# Patient Record
Sex: Female | Born: 1990 | Race: Black or African American | Hispanic: No | Marital: Single | Smoking: Current every day smoker
Health system: Southern US, Community
[De-identification: ages and names within clinical notes are randomized; demographics above are authoritative.]

## PROBLEM LIST (undated history)

## (undated) DIAGNOSIS — J45909 Unspecified asthma, uncomplicated: Secondary | ICD-10-CM

## (undated) DIAGNOSIS — G43909 Migraine, unspecified, not intractable, without status migrainosus: Secondary | ICD-10-CM

## (undated) DIAGNOSIS — F329 Major depressive disorder, single episode, unspecified: Secondary | ICD-10-CM

## (undated) DIAGNOSIS — I10 Essential (primary) hypertension: Secondary | ICD-10-CM

## (undated) DIAGNOSIS — J302 Other seasonal allergic rhinitis: Secondary | ICD-10-CM

## (undated) DIAGNOSIS — F32A Depression, unspecified: Secondary | ICD-10-CM

## (undated) HISTORY — PX: TONSILLECTOMY: SUR1361

---
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
## 2013-04-04 ENCOUNTER — Emergency Department (HOSPITAL_BASED_OUTPATIENT_CLINIC_OR_DEPARTMENT_OTHER)
Admission: EM | Admit: 2013-04-04 | Discharge: 2013-04-04 | Disposition: A | Discharge status: Home / Self Care | Payer: Medicare Other | Attending: Emergency Medicine | Admitting: Emergency Medicine

## 2013-04-04 ENCOUNTER — Encounter (HOSPITAL_BASED_OUTPATIENT_CLINIC_OR_DEPARTMENT_OTHER): Payer: Self-pay | Admitting: *Deleted

## 2013-04-04 ENCOUNTER — Emergency Department (HOSPITAL_BASED_OUTPATIENT_CLINIC_OR_DEPARTMENT_OTHER): Payer: Medicare Other

## 2013-04-04 DIAGNOSIS — O9989 Other specified diseases and conditions complicating pregnancy, childbirth and the puerperium: Secondary | ICD-10-CM | POA: Insufficient documentation

## 2013-04-04 DIAGNOSIS — F329 Major depressive disorder, single episode, unspecified: Secondary | ICD-10-CM | POA: Insufficient documentation

## 2013-04-04 DIAGNOSIS — O2390 Unspecified genitourinary tract infection in pregnancy, unspecified trimester: Secondary | ICD-10-CM | POA: Insufficient documentation

## 2013-04-04 DIAGNOSIS — R109 Unspecified abdominal pain: Secondary | ICD-10-CM | POA: Insufficient documentation

## 2013-04-04 DIAGNOSIS — N760 Acute vaginitis: Secondary | ICD-10-CM

## 2013-04-04 DIAGNOSIS — J45909 Unspecified asthma, uncomplicated: Secondary | ICD-10-CM | POA: Insufficient documentation

## 2013-04-04 DIAGNOSIS — O99330 Smoking (tobacco) complicating pregnancy, unspecified trimester: Secondary | ICD-10-CM | POA: Insufficient documentation

## 2013-04-04 DIAGNOSIS — Z79899 Other long term (current) drug therapy: Secondary | ICD-10-CM | POA: Insufficient documentation

## 2013-04-04 DIAGNOSIS — O99340 Other mental disorders complicating pregnancy, unspecified trimester: Secondary | ICD-10-CM | POA: Insufficient documentation

## 2013-04-04 DIAGNOSIS — O169 Unspecified maternal hypertension, unspecified trimester: Secondary | ICD-10-CM | POA: Insufficient documentation

## 2013-04-04 DIAGNOSIS — O26899 Other specified pregnancy related conditions, unspecified trimester: Secondary | ICD-10-CM

## 2013-04-04 DIAGNOSIS — F3289 Other specified depressive episodes: Secondary | ICD-10-CM | POA: Insufficient documentation

## 2013-04-04 HISTORY — DX: Major depressive disorder, single episode, unspecified: F32.9

## 2013-04-04 HISTORY — DX: Other seasonal allergic rhinitis: J30.2

## 2013-04-04 HISTORY — DX: Unspecified asthma, uncomplicated: J45.909

## 2013-04-04 HISTORY — DX: Essential (primary) hypertension: I10

## 2013-04-04 HISTORY — DX: Depression, unspecified: F32.A

## 2013-04-04 LAB — CBC WITH DIFFERENTIAL/PLATELET
HCT: 33 % — ABNORMAL LOW (ref 36.0–46.0)
Hemoglobin: 12 g/dL (ref 12.0–15.0)
Lymphocytes Relative: 37 % (ref 12–46)
Lymphs Abs: 3.3 10*3/uL (ref 0.7–4.0)
MCV: 74.2 fL — ABNORMAL LOW (ref 78.0–100.0)
Monocytes Absolute: 1 10*3/uL (ref 0.1–1.0)
Monocytes Relative: 11 % (ref 3–12)
Neutro Abs: 4.4 10*3/uL (ref 1.7–7.7)
WBC: 8.8 10*3/uL (ref 4.0–10.5)

## 2013-04-04 LAB — URINALYSIS, ROUTINE W REFLEX MICROSCOPIC
Bilirubin Urine: NEGATIVE
Glucose, UA: NEGATIVE mg/dL
Hgb urine dipstick: NEGATIVE
Specific Gravity, Urine: 1.012 (ref 1.005–1.030)

## 2013-04-04 LAB — WET PREP, GENITAL
Trich, Wet Prep: NONE SEEN
Yeast Wet Prep HPF POC: NONE SEEN

## 2013-04-04 LAB — PREGNANCY, URINE: Preg Test, Ur: POSITIVE — AB

## 2013-04-04 LAB — BASIC METABOLIC PANEL
BUN: 6 mg/dL (ref 6–23)
CO2: 26 mEq/L (ref 19–32)
Calcium: 9.2 mg/dL (ref 8.4–10.5)
Creatinine, Ser: 0.7 mg/dL (ref 0.50–1.10)
Glucose, Bld: 89 mg/dL (ref 70–99)
Sodium: 139 mEq/L (ref 135–145)

## 2013-04-04 MED ORDER — METRONIDAZOLE 500 MG PO TABS
2000.0000 mg | ORAL_TABLET | Freq: Once | ORAL | Status: AC
  Administered 2013-04-04: 2000 mg via ORAL
  Filled 2013-04-04: qty 4

## 2013-04-04 NOTE — ED Notes (Signed)
[redacted] weeks pregnant. C.o lower mid abdominal pain that she describes at "just hurts". Not able to eat. Denies nausea. Ambulatory to the bathroom without difficulty.

## 2013-04-04 NOTE — ED Notes (Signed)
Diane from [facility] blood bank called to give blood type and rh, O positive.  Results given to Dr. Bernette Mayers.

## 2013-04-04 NOTE — ED Provider Notes (Signed)
History     CSN: [id number]  Arrival date & time 04/04/13  1155   First MD Initiated Contact with Patient 04/04/13 1210      Chief Complaint  Patient presents with  . Abdominal Pain    (Consider location/radiation/quality/duration/timing/severity/associated sxs/prior treatment) HPI Comments: Pt states that she started with generalized cramping this morning:pt states that she had a positive pregnancy test at pcp, but has not seen ob and has not had ultrasound:this is first pregancy  Patient is a 22 y.o. female presenting with abdominal pain. The history is provided by the patient. No language interpreter was used.  Abdominal Pain Pain location: lower abdomen. Pain quality: aching   Pain radiates to:  Does not radiate Pain severity:  Moderate Onset quality:  Gradual Timing:  Constant Chronicity:  New Context: not alcohol use   Relieved by:  Nothing Worsened by:  Nothing tried Ineffective treatments:  None tried Associated symptoms: no dysuria, no fever, no nausea, no vaginal bleeding and no vaginal discharge     Past Medical History  Diagnosis Date  . Hypertension   . Asthma   . Seasonal allergies   . Depression     Past Surgical History  Procedure Laterality Date  . Tonsillectomy      No family history on file.  History  Substance Use Topics  . Smoking status: Current Every Day Smoker -- 0.50 packs/day    Types: Cigarettes  . Smokeless tobacco: Not on file  . Alcohol Use: No    OB History   Grav Para Term Preterm Abortions TAB SAB Ect Mult Living   1               Review of Systems  Constitutional: Negative for fever.  Respiratory: Negative.   Cardiovascular: Negative.   Gastrointestinal: Positive for abdominal pain. Negative for nausea.  Genitourinary: Negative for dysuria, vaginal bleeding and vaginal discharge.    Allergies  Review of patient's allergies indicates no known allergies.  Home Medications   Current Outpatient Rx  Name  Route   Sig  Dispense  Refill  . amLODipine (NORVASC) 10 MG tablet   Oral   Take 10 mg by mouth daily.         . DULoxetine (CYMBALTA) 60 MG capsule   Oral   Take 60 mg by mouth daily.         . hydrochlorothiazide (HYDRODIURIL) 25 MG tablet   Oral   Take 25 mg by mouth daily.         Marland Kitchen PRENATAL VITAMINS PO   Oral   Take by mouth.           BP 138/84  Pulse 75  Temp(Src) 97.5 F (36.4 C) (Oral)  Resp 18  Ht 5' 9.5" (1.765 m)  Wt 285 lb (129.275 kg)  BMI 41.5 kg/m2  SpO2 100%  LMP 02/24/2013  Physical Exam  Nursing note and vitals reviewed. Constitutional: She is oriented to person, place, and time. She appears well-developed and well-nourished.  HENT:  Head: Normocephalic and atraumatic.  Eyes: Conjunctivae and EOM are normal.  Neck: Normal range of motion. Neck supple.  Cardiovascular: Normal rate and regular rhythm.   Pulmonary/Chest: Effort normal and breath sounds normal.  Abdominal: Soft. Bowel sounds are normal. There is tenderness in the right lower quadrant and left lower quadrant.  Genitourinary:  White discharge:-cmt  Musculoskeletal: Normal range of motion.  Neurological: She is alert and oriented to person, place, and time.  Skin: Skin is  warm and dry.    ED Course  Procedures (including critical care time)  Labs Reviewed  WET PREP, GENITAL - Abnormal; Notable for the following:    Clue Cells Wet Prep HPF POC FEW (*)    WBC, Wet Prep HPF POC FEW (*)    All other components within normal limits  PREGNANCY, URINE - Abnormal; Notable for the following:    Preg Test, Ur POSITIVE (*)    All other components within normal limits  CBC WITH DIFFERENTIAL - Abnormal; Notable for the following:    HCT 33.0 (*)    MCV 74.2 (*)    MCHC 36.4 (*)    Platelets 447 (*)    All other components within normal limits  HCG, QUANTITATIVE, PREGNANCY - Abnormal; Notable for the following:    hCG, Beta Chain, Quant, S 2278 (*)    All other components within normal  limits  GC/CHLAMYDIA PROBE AMP  URINALYSIS, ROUTINE W REFLEX MICROSCOPIC  BASIC METABOLIC PANEL  RH IG WORKUP (INCLUDES ABO/RH)   US Ob Comp Less 14 Wks  04/04/2013  *RADIOLOGY REPORT*  Clinical Data: Pelvic pain in early pregnancy.  Beta HCG 2278  OBSTETRIC <14 WK [place] AND TRANSVAGINAL OB US  Technique:  Both transabdominal and transvaginal ultrasound examinations were performed for complete evaluation of the gestation as well as the maternal uterus, adnexal regions, and pelvic cul-de-sac.  Transvaginal technique was performed to assess early pregnancy.  Comparison:  None.  Intrauterine gestational sac:  Visualized/normal in shape. Yolk sac: Seen Embryo: Not seen Cardiac Activity: Not applicable  MSD: 5.3 mm  5 w 2 d  Maternal uterus/adnexae: The left ovary has a normal appearance measuring 2.2 x 2.7 x 2.1 cm.  The right ovary has a normal appearance measuring 3.8 x 3.3 x 3.4 cm and containing a corpus luteum.  A small amount of simple free fluid is noted in the cul-de-sac and extending to the adnexa. No separate adnexal masses are seen.  IMPRESSION: Intrauterine gestational sac with yolk sac.  No evidence for a fetal pole is seen but would not necessarily be expected at today's mean sac diameter of 5.3 mm.  Follow-up evaluation can be performed sonographically in > 10 days to assess for appropriate progression of gestation as we would expect to see visualization of a fetal pole with confidence > 10 days following visualization of a gestational sac with yolk sac.  Normal ovaries with right corpus luteum.   Original Report Authenticated By: Rhodia Albright, M.D.    US Ob Transvaginal  04/04/2013  *RADIOLOGY REPORT*  Clinical Data: Pelvic pain in early pregnancy.  Beta HCG 2278  OBSTETRIC <14 WK [place] AND TRANSVAGINAL OB US  Technique:  Both transabdominal and transvaginal ultrasound examinations were performed for complete evaluation of the gestation as well as the maternal uterus, adnexal regions, and pelvic  cul-de-sac.  Transvaginal technique was performed to assess early pregnancy.  Comparison:  None.  Intrauterine gestational sac:  Visualized/normal in shape. Yolk sac: Seen Embryo: Not seen Cardiac Activity: Not applicable  MSD: 5.3 mm  5 w 2 d  Maternal uterus/adnexae: The left ovary has a normal appearance measuring 2.2 x 2.7 x 2.1 cm.  The right ovary has a normal appearance measuring 3.8 x 3.3 x 3.4 cm and containing a corpus luteum.  A small amount of simple free fluid is noted in the cul-de-sac and extending to the adnexa. No separate adnexal masses are seen.  IMPRESSION: Intrauterine gestational sac with yolk sac.  No evidence for a fetal pole is seen but would not necessarily be expected at today's mean sac diameter of 5.3 mm.  Follow-up evaluation can be performed sonographically in > 10 days to assess for appropriate progression of gestation as we would expect to see visualization of a fetal pole with confidence > 10 days following visualization of a gestational sac with yolk sac.  Normal ovaries with right corpus luteum.   Original Report Authenticated By: Rhodia Albright, M.D.      1. Abdominal pain in pregnancy   2. BV (bacterial vaginosis)       MDM  Pt is more comfortable at this time:intauterine sac and yolk sac noted:pt is to see [facility]:pt given flagyl for bv:pt is okay to follow up:pt instructed to take tylenol:abdomen is non acute       Teressa Lower, NP 04/04/13 1404

## 2013-04-04 NOTE — ED Provider Notes (Signed)
Medical screening examination/treatment/procedure(s) were performed by non-physician practitioner and as supervising physician I was immediately available for consultation/collaboration.   Charles B. Bernette Mayers, MD 04/04/13 1553

## 2013-04-05 LAB — GC/CHLAMYDIA PROBE AMP: GC Probe RNA: NEGATIVE

## 2013-05-13 ENCOUNTER — Emergency Department (HOSPITAL_BASED_OUTPATIENT_CLINIC_OR_DEPARTMENT_OTHER)
Admission: EM | Admit: 2013-05-13 | Discharge: 2013-05-14 | Disposition: A | Discharge status: Home / Self Care | Payer: Medicare Other | Attending: Emergency Medicine | Admitting: Emergency Medicine

## 2013-05-13 ENCOUNTER — Encounter (HOSPITAL_BASED_OUTPATIENT_CLINIC_OR_DEPARTMENT_OTHER): Payer: Self-pay | Admitting: Emergency Medicine

## 2013-05-13 DIAGNOSIS — O210 Mild hyperemesis gravidarum: Secondary | ICD-10-CM | POA: Insufficient documentation

## 2013-05-13 DIAGNOSIS — Z79899 Other long term (current) drug therapy: Secondary | ICD-10-CM | POA: Insufficient documentation

## 2013-05-13 DIAGNOSIS — J45909 Unspecified asthma, uncomplicated: Secondary | ICD-10-CM | POA: Insufficient documentation

## 2013-05-13 DIAGNOSIS — F329 Major depressive disorder, single episode, unspecified: Secondary | ICD-10-CM | POA: Insufficient documentation

## 2013-05-13 DIAGNOSIS — H538 Other visual disturbances: Secondary | ICD-10-CM | POA: Insufficient documentation

## 2013-05-13 DIAGNOSIS — O169 Unspecified maternal hypertension, unspecified trimester: Secondary | ICD-10-CM | POA: Insufficient documentation

## 2013-05-13 DIAGNOSIS — O99340 Other mental disorders complicating pregnancy, unspecified trimester: Secondary | ICD-10-CM | POA: Insufficient documentation

## 2013-05-13 DIAGNOSIS — F3289 Other specified depressive episodes: Secondary | ICD-10-CM | POA: Insufficient documentation

## 2013-05-13 DIAGNOSIS — O9989 Other specified diseases and conditions complicating pregnancy, childbirth and the puerperium: Secondary | ICD-10-CM | POA: Insufficient documentation

## 2013-05-13 DIAGNOSIS — H53149 Visual discomfort, unspecified: Secondary | ICD-10-CM | POA: Insufficient documentation

## 2013-05-13 DIAGNOSIS — O99330 Smoking (tobacco) complicating pregnancy, unspecified trimester: Secondary | ICD-10-CM | POA: Insufficient documentation

## 2013-05-13 DIAGNOSIS — R51 Headache: Secondary | ICD-10-CM | POA: Insufficient documentation
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
## 2013-05-13 NOTE — ED Notes (Signed)
Pt c/o headache x2 days

## 2013-05-14 LAB — URINALYSIS, ROUTINE W REFLEX MICROSCOPIC
Glucose, UA: NEGATIVE mg/dL
Hgb urine dipstick: NEGATIVE
Protein, ur: NEGATIVE mg/dL

## 2013-05-14 MED ORDER — PROMETHAZINE HCL 25 MG/ML IJ SOLN
25.0000 mg | Freq: Once | INTRAMUSCULAR | Status: AC
  Administered 2013-05-14: 25 mg via INTRAMUSCULAR
  Filled 2013-05-14: qty 1

## 2013-05-14 MED ORDER — ACETAMINOPHEN 325 MG PO TABS
650.0000 mg | ORAL_TABLET | Freq: Once | ORAL | Status: AC
  Administered 2013-05-14: 650 mg via ORAL
  Filled 2013-05-14: qty 2

## 2013-05-14 MED ORDER — ONDANSETRON 8 MG PO TBDP
8.0000 mg | ORAL_TABLET | Freq: Once | ORAL | Status: AC
  Administered 2013-05-14: 8 mg via ORAL
  Filled 2013-05-14: qty 1

## 2013-05-14 NOTE — ED Provider Notes (Signed)
History    This chart was scribed for Tracey Bucco, MD by Donne Anon, ED Scribe. This patient was seen in room MH02/MH02 and the patient's care was started at 2359.   CSN: [id number]  Arrival date & time 05/13/13  2347   First MD Initiated Contact with Patient 05/13/13 2359      Chief Complaint  Patient presents with  . Headache     The history is provided by the patient. No language interpreter was used.   HPI Comments: Tracey Baker is a 22 y.o. female who presents to the Emergency Department complaining of 2 days of gradual onset, gradually worsening, constant frontal HA that radiates to the top of her head. She is about 2 months pregnant and her LMP was 02/24/13. She was seen at [facility] for a similar type headache several months ago, but says it feels worse today. She reports associated blurred vision, nausea, vomiting (baseline for pregnancy) and photophobia. She has not tried anything for the pain. She denies rhinorrhea, congestion, fever, neck pain, cough, abdominal pain, vaginal bleeding or any other pain. She denies any stressors in her life. She has an appointment with her OBGYN tomorrow.  Past Medical History  Diagnosis Date  . Hypertension   . Asthma   . Seasonal allergies   . Depression     Past Surgical History  Procedure Laterality Date  . Tonsillectomy      No family history on file.  History  Substance Use Topics  . Smoking status: Current Every Day Smoker -- 0.50 packs/day    Types: Cigarettes  . Smokeless tobacco: Not on file  . Alcohol Use: No    OB History   Grav Para Term Preterm Abortions TAB SAB Ect Mult Living   1               Review of Systems  Constitutional: Negative for fever.  HENT: Negative for congestion, rhinorrhea, neck pain and neck stiffness.   Eyes: Positive for photophobia and visual disturbance.  Respiratory: Negative for cough.   Gastrointestinal: Positive for nausea and vomiting. Negative for abdominal pain.   Genitourinary: Negative for vaginal bleeding and vaginal discharge.  Neurological: Positive for headaches.  All other systems reviewed and are negative.    Allergies  Review of patient's allergies indicates no known allergies.  Home Medications   Current Outpatient Rx  Name  Route  Sig  Dispense  Refill  . amLODipine (NORVASC) 10 MG tablet   Oral   Take 10 mg by mouth daily.         . hydrochlorothiazide (HYDRODIURIL) 25 MG tablet   Oral   Take 25 mg by mouth daily.         Marland Kitchen PRENATAL VITAMINS PO   Oral   Take by mouth.         . DULoxetine (CYMBALTA) 60 MG capsule   Oral   Take 60 mg by mouth daily.           BP 141/95  Pulse 90  Temp(Src) 98.8 F (37.1 C) (Oral)  Resp 18  SpO2 100%  LMP 02/24/2013  Physical Exam  Constitutional: She is oriented to person, place, and time. She appears well-developed and well-nourished.  HENT:  Head: Normocephalic and atraumatic.  Eyes: Pupils are equal, round, and reactive to light.  Neck: Normal range of motion. Neck supple.  No meningeal signs  Cardiovascular: Normal rate, regular rhythm and normal heart sounds.   Pulmonary/Chest: Effort normal and breath sounds  normal. No respiratory distress. She has no wheezes. She has no rales. She exhibits no tenderness.  Abdominal: Soft. Bowel sounds are normal. There is no tenderness. There is no rebound and no guarding.  Musculoskeletal: Normal range of motion. She exhibits no edema.  Lymphadenopathy:    She has no cervical adenopathy.  Neurological: She is alert and oriented to person, place, and time. She has normal strength. No cranial nerve deficit or sensory deficit. GCS eye subscore is 4. GCS verbal subscore is 5. GCS motor subscore is 6.  Skin: Skin is warm and dry. No rash noted.  Psychiatric: She has a normal mood and affect.    ED Course  Procedures (including critical care time) DIAGNOSTIC STUDIES: Oxygen Saturation is 100% on RA, normal by my interpretation.     COORDINATION OF CARE: 12:00 AM Discussed treatment plan which includes Tylenol and nausea medication with pt at bedside and pt agreed to plan.     Results for orders placed during the hospital encounter of 05/13/13  URINALYSIS, ROUTINE W REFLEX MICROSCOPIC      Result Value Range   Color, Urine YELLOW  YELLOW   APPearance CLEAR  CLEAR   Specific Gravity, Urine 1.024  1.005 - 1.030   pH 6.0  5.0 - 8.0   Glucose, UA NEGATIVE  NEGATIVE mg/dL   Hgb urine dipstick NEGATIVE  NEGATIVE   Bilirubin Urine NEGATIVE  NEGATIVE   Ketones, ur NEGATIVE  NEGATIVE mg/dL   Protein, ur NEGATIVE  NEGATIVE mg/dL   Urobilinogen, UA 1.0  0.0 - 1.0 mg/dL   Nitrite NEGATIVE  NEGATIVE   Leukocytes, UA NEGATIVE  NEGATIVE   No results found.    1. Headache       MDM  Patient is given a shot of Phenergan and her headache was completely relieved after this. She states she was waiting to go home. She had no unusual symptoms suggestive of meningitis or subarachnoid hemorrhage. She had no proteinuria which would be suggestive of preeclampsia and she is still early in pregnancy. Her blood pressure is mildly elevated. She has an appointment to followup tomorrow with her OB/GYN.    I personally performed the services described in this documentation, which was scribed in my presence.  The recorded information has been reviewed and considered.      Tracey Bucco, MD 05/14/13 [phone]

## 2013-06-15 ENCOUNTER — Encounter (HOSPITAL_BASED_OUTPATIENT_CLINIC_OR_DEPARTMENT_OTHER): Payer: Self-pay | Admitting: *Deleted

## 2013-06-15 ENCOUNTER — Emergency Department (HOSPITAL_BASED_OUTPATIENT_CLINIC_OR_DEPARTMENT_OTHER)
Admission: EM | Admit: 2013-06-15 | Discharge: 2013-06-15 | Disposition: A | Discharge status: Home / Self Care | Payer: Medicare Other | Attending: Emergency Medicine | Admitting: Emergency Medicine

## 2013-06-15 ENCOUNTER — Emergency Department (HOSPITAL_BASED_OUTPATIENT_CLINIC_OR_DEPARTMENT_OTHER): Payer: Medicare Other

## 2013-06-15 DIAGNOSIS — F3289 Other specified depressive episodes: Secondary | ICD-10-CM | POA: Insufficient documentation

## 2013-06-15 DIAGNOSIS — S0003XA Contusion of scalp, initial encounter: Secondary | ICD-10-CM | POA: Insufficient documentation

## 2013-06-15 DIAGNOSIS — O9989 Other specified diseases and conditions complicating pregnancy, childbirth and the puerperium: Secondary | ICD-10-CM | POA: Insufficient documentation

## 2013-06-15 DIAGNOSIS — I10 Essential (primary) hypertension: Secondary | ICD-10-CM | POA: Insufficient documentation

## 2013-06-15 DIAGNOSIS — S46909A Unspecified injury of unspecified muscle, fascia and tendon at shoulder and upper arm level, unspecified arm, initial encounter: Secondary | ICD-10-CM | POA: Insufficient documentation

## 2013-06-15 DIAGNOSIS — S4980XA Other specified injuries of shoulder and upper arm, unspecified arm, initial encounter: Secondary | ICD-10-CM | POA: Insufficient documentation

## 2013-06-15 DIAGNOSIS — J45909 Unspecified asthma, uncomplicated: Secondary | ICD-10-CM | POA: Insufficient documentation

## 2013-06-15 DIAGNOSIS — Z79899 Other long term (current) drug therapy: Secondary | ICD-10-CM | POA: Insufficient documentation

## 2013-06-15 DIAGNOSIS — S0083XA Contusion of other part of head, initial encounter: Secondary | ICD-10-CM | POA: Insufficient documentation

## 2013-06-15 DIAGNOSIS — F17200 Nicotine dependence, unspecified, uncomplicated: Secondary | ICD-10-CM | POA: Insufficient documentation

## 2013-06-15 DIAGNOSIS — F329 Major depressive disorder, single episode, unspecified: Secondary | ICD-10-CM | POA: Insufficient documentation

## 2013-06-15 DIAGNOSIS — Z9109 Other allergy status, other than to drugs and biological substances: Secondary | ICD-10-CM | POA: Insufficient documentation

## 2013-06-15 DIAGNOSIS — S301XXA Contusion of abdominal wall, initial encounter: Secondary | ICD-10-CM

## 2013-06-15 DIAGNOSIS — S1093XA Contusion of unspecified part of neck, initial encounter: Secondary | ICD-10-CM

## 2013-06-15 LAB — URINALYSIS, ROUTINE W REFLEX MICROSCOPIC
Glucose, UA: NEGATIVE mg/dL
Ketones, ur: >80 mg/dL — AB
Leukocytes, UA: NEGATIVE
pH: 7 (ref 5.0–8.0)
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
## 2013-06-16 NOTE — ED Provider Notes (Signed)
Medical screening examination/treatment/procedure(s) were performed by non-physician practitioner and as supervising physician I was immediately available for consultation/collaboration.   Gwyneth Sprout, MD 06/16/13 [phone]

## 2013-09-19 ENCOUNTER — Encounter (HOSPITAL_BASED_OUTPATIENT_CLINIC_OR_DEPARTMENT_OTHER): Payer: Self-pay | Admitting: Emergency Medicine

## 2013-09-19 ENCOUNTER — Emergency Department (HOSPITAL_BASED_OUTPATIENT_CLINIC_OR_DEPARTMENT_OTHER)
Admission: EM | Admit: 2013-09-19 | Discharge: 2013-09-19 | Disposition: A | Discharge status: Other Acute Inpatient Hospital | Payer: Medicare Other | Attending: Emergency Medicine | Admitting: Emergency Medicine

## 2013-09-19 DIAGNOSIS — N898 Other specified noninflammatory disorders of vagina: Secondary | ICD-10-CM | POA: Insufficient documentation

## 2013-09-19 DIAGNOSIS — N949 Unspecified condition associated with female genital organs and menstrual cycle: Secondary | ICD-10-CM | POA: Insufficient documentation

## 2013-09-19 DIAGNOSIS — R34 Anuria and oliguria: Secondary | ICD-10-CM | POA: Insufficient documentation

## 2013-09-19 DIAGNOSIS — O9989 Other specified diseases and conditions complicating pregnancy, childbirth and the puerperium: Secondary | ICD-10-CM | POA: Insufficient documentation

## 2013-09-19 DIAGNOSIS — J45909 Unspecified asthma, uncomplicated: Secondary | ICD-10-CM | POA: Insufficient documentation

## 2013-09-19 DIAGNOSIS — R300 Dysuria: Secondary | ICD-10-CM | POA: Insufficient documentation

## 2013-09-19 DIAGNOSIS — Z79899 Other long term (current) drug therapy: Secondary | ICD-10-CM | POA: Insufficient documentation

## 2013-09-19 DIAGNOSIS — O4702 False labor before 37 completed weeks of gestation, second trimester: Secondary | ICD-10-CM

## 2013-09-19 DIAGNOSIS — O4700 False labor before 37 completed weeks of gestation, unspecified trimester: Secondary | ICD-10-CM | POA: Insufficient documentation

## 2013-09-19 DIAGNOSIS — O169 Unspecified maternal hypertension, unspecified trimester: Secondary | ICD-10-CM | POA: Insufficient documentation

## 2013-09-19 LAB — WET PREP, GENITAL
Trich, Wet Prep: NONE SEEN
Yeast Wet Prep HPF POC: NONE SEEN

## 2013-09-19 LAB — CBC WITH DIFFERENTIAL/PLATELET
HCT: 30.8 % — ABNORMAL LOW (ref 36.0–46.0)
Hemoglobin: 11.1 g/dL — ABNORMAL LOW (ref 12.0–15.0)
Lymphocytes Relative: 39 % (ref 12–46)
MCHC: 36 g/dL (ref 30.0–36.0)
Monocytes Absolute: 1.6 10*3/uL — ABNORMAL HIGH (ref 0.1–1.0)
Monocytes Relative: 13 % — ABNORMAL HIGH (ref 3–12)
Neutro Abs: 5.7 10*3/uL (ref 1.7–7.7)
WBC: 12.2 10*3/uL — ABNORMAL HIGH (ref 4.0–10.5)

## 2013-09-19 LAB — URINALYSIS, ROUTINE W REFLEX MICROSCOPIC
Glucose, UA: NEGATIVE mg/dL
Hgb urine dipstick: NEGATIVE
Nitrite: NEGATIVE
Protein, ur: NEGATIVE mg/dL
Specific Gravity, Urine: 1.027 (ref 1.005–1.030)
Urobilinogen, UA: 1 mg/dL (ref 0.0–1.0)

## 2013-09-19 LAB — COMPREHENSIVE METABOLIC PANEL
BUN: 5 mg/dL — ABNORMAL LOW (ref 6–23)
CO2: 18 mEq/L — ABNORMAL LOW (ref 19–32)
Chloride: 104 mEq/L (ref 96–112)
Creatinine, Ser: 0.7 mg/dL (ref 0.50–1.10)
GFR calc non Af Amer: >90 mL/min (ref >90)
Total Bilirubin: 0.2 mg/dL — ABNORMAL LOW (ref 0.3–1.2)

## 2013-09-19 LAB — URINE MICROSCOPIC-ADD ON

## 2013-09-19 MED ORDER — MAGNESIUM SULFATE 50 % IJ SOLN
6.0000 g | Freq: Once | INTRAMUSCULAR | Status: AC
  Administered 2013-09-19: 6 g via INTRAVENOUS
  Filled 2013-09-19: qty 12

## 2013-09-19 MED ORDER — MAGNESIUM SULFATE 40 G IN LACTATED RINGERS - SIMPLE
2.0000 g/h | INTRAVENOUS | Status: DC
  Filled 2013-09-19: qty 500

## 2013-09-19 MED ORDER — SODIUM CHLORIDE 0.9 % IV BOLUS (SEPSIS)
1000.0000 mL | Freq: Once | INTRAVENOUS | Status: AC
  Administered 2013-09-19: 1000 mL via INTRAVENOUS

## 2013-09-19 MED ORDER — PENICILLIN G POTASSIUM 5000000 UNITS IJ SOLR
2.5000 10*6.[IU] | INTRAVENOUS | Status: DC
  Filled 2013-09-19: qty 2.5

## 2013-09-19 MED ORDER — PENICILLIN G POTASSIUM 5000000 UNITS IJ SOLR
5.0000 10*6.[IU] | Freq: Once | INTRAMUSCULAR | Status: DC
  Filled 2013-09-19: qty 5

## 2013-09-19 MED ORDER — BETAMETHASONE SOD PHOS & ACET 6 (3-3) MG/ML IJ SUSP
12.0000 mg | Freq: Once | INTRAMUSCULAR | Status: DC
  Filled 2013-09-19: qty 2
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
## 2013-09-19 NOTE — ED Notes (Signed)
Rapid OB Nurse informed of pt on monitor per Jessica Priest, RN

## 2013-09-19 NOTE — ED Notes (Signed)
Pt tx to [facility], report provided to [place] in labor and delivery at [facility], Informed that medications were not administered prior to transport because we do not have access to medications ordered. Informed that 6gm iv started at time of arrival of care[facility] and [facility], medication should be started at time of carelink arrival and given during transport over 20 min, Marcelino Duster, Charity fundraiser at [facility] made aware of medication that will be needed once patient arrives. Pt alert and oriented, starting to have some more lower vaginal discomfort, otherwise denies feeling contractions, OB rapid response nurse made aware of tx to [facility]. Pt in NAD at time of discharge

## 2013-09-19 NOTE — Progress Notes (Signed)
FHT 155bpm, reactive w/no decels. Contracting q 2-5 minutes, lasting 30-50 seconds w/frequent uterine irritability. Notified Raynelle Fanning RN @ [facility] of uterine activity and fht.

## 2013-09-19 NOTE — ED Notes (Signed)
Pt is [redacted] weeks pregnant and developed vaginal pain at 1730 today.

## 2013-09-19 NOTE — Progress Notes (Signed)
[facility] ED called to report a pt at 28-29 wks who gets care in [place] arrived with c/o lower abd pain/cramping. Pt denies vaginal bleeding or leakage of fluid. Pt also states she has not felt fetal movement since 13:30. Surveillance begun.

## 2013-09-20 NOTE — ED Notes (Signed)
S/W Cordelia Pen in [employer].  Informed that the vaginal probe for GC/CH was lost and that the [facility] is being run on the urine.  Update given to Dr. Rosalia Hammers.

## 2013-09-21 LAB — GC/CHLAMYDIA PROBE AMP
CT Probe RNA: NEGATIVE
GC Probe RNA: NEGATIVE

## 2013-09-21 LAB — URINE CULTURE: Colony Count: NO GROWTH

## 2014-04-01 ENCOUNTER — Encounter (HOSPITAL_BASED_OUTPATIENT_CLINIC_OR_DEPARTMENT_OTHER): Payer: Self-pay | Admitting: Emergency Medicine

## 2014-04-01 ENCOUNTER — Emergency Department (HOSPITAL_BASED_OUTPATIENT_CLINIC_OR_DEPARTMENT_OTHER)
Admission: EM | Admit: 2014-04-01 | Discharge: 2014-04-02 | Disposition: A | Discharge status: Home / Self Care | Payer: PRIVATE HEALTH INSURANCE | Attending: Emergency Medicine | Admitting: Emergency Medicine

## 2014-04-01 DIAGNOSIS — T394X5A Adverse effect of antirheumatics, not elsewhere classified, initial encounter: Secondary | ICD-10-CM | POA: Insufficient documentation

## 2014-04-01 DIAGNOSIS — F17200 Nicotine dependence, unspecified, uncomplicated: Secondary | ICD-10-CM | POA: Insufficient documentation

## 2014-04-01 DIAGNOSIS — T7840XA Allergy, unspecified, initial encounter: Secondary | ICD-10-CM

## 2014-04-01 DIAGNOSIS — F329 Major depressive disorder, single episode, unspecified: Secondary | ICD-10-CM | POA: Insufficient documentation

## 2014-04-01 DIAGNOSIS — L500 Allergic urticaria: Secondary | ICD-10-CM | POA: Insufficient documentation

## 2014-04-01 DIAGNOSIS — F411 Generalized anxiety disorder: Secondary | ICD-10-CM | POA: Insufficient documentation

## 2014-04-01 DIAGNOSIS — L509 Urticaria, unspecified: Secondary | ICD-10-CM

## 2014-04-01 DIAGNOSIS — Z79899 Other long term (current) drug therapy: Secondary | ICD-10-CM | POA: Insufficient documentation

## 2014-04-01 DIAGNOSIS — J45909 Unspecified asthma, uncomplicated: Secondary | ICD-10-CM | POA: Insufficient documentation

## 2014-04-01 DIAGNOSIS — I10 Essential (primary) hypertension: Secondary | ICD-10-CM | POA: Insufficient documentation

## 2014-04-01 DIAGNOSIS — F3289 Other specified depressive episodes: Secondary | ICD-10-CM | POA: Insufficient documentation

## 2014-04-01 NOTE — ED Notes (Signed)
Pt took Naproxen 2 hrs ago.  Started itching about 90 min ago.  Took Benadryl but it has gotten worse.  Itching all over and a few hives noted on upper body.

## 2014-04-02 MED ORDER — EPINEPHRINE HCL 1 MG/ML IJ SOLN
0.3000 mg | Freq: Once | INTRAMUSCULAR | Status: AC
  Administered 2014-04-02: 0.3 mg via INTRAMUSCULAR

## 2014-04-02 MED ORDER — DIPHENHYDRAMINE HCL 50 MG/ML IJ SOLN
50.0000 mg | Freq: Once | INTRAMUSCULAR | Status: AC
  Administered 2014-04-02: 50 mg via INTRAVENOUS

## 2014-04-02 MED ORDER — DIPHENHYDRAMINE HCL 25 MG PO TABS: 25.0000 mg | ORAL_TABLET | Freq: Four times a day (QID) | ORAL | Status: DC

## 2014-04-02 MED ORDER — FAMOTIDINE IN NACL 20-0.9 MG/50ML-% IV SOLN
20.0000 mg | Freq: Once | INTRAVENOUS | Status: AC
  Administered 2014-04-02: 20 mg via INTRAVENOUS

## 2014-04-02 MED ORDER — PREDNISONE 10 MG PO TABS: 20.0000 mg | ORAL_TABLET | Freq: Every day | ORAL | Status: DC

## 2014-04-02 MED ORDER — METHYLPREDNISOLONE SODIUM SUCC 125 MG IJ SOLR
INTRAMUSCULAR | Status: AC
  Administered 2014-04-02: 125 mg via INTRAVENOUS
  Filled 2014-04-02: qty 2

## 2014-04-02 MED ORDER — FAMOTIDINE IN NACL 20-0.9 MG/50ML-% IV SOLN
INTRAVENOUS | Status: AC
  Administered 2014-04-02: 20 mg via INTRAVENOUS
  Filled 2014-04-02: qty 50

## 2014-04-02 MED ORDER — EPINEPHRINE HCL 1 MG/ML IJ SOLN
INTRAMUSCULAR | Status: AC
  Administered 2014-04-02: 0.3 mg via INTRAMUSCULAR
  Filled 2014-04-02: qty 1

## 2014-04-02 MED ORDER — METHYLPREDNISOLONE SODIUM SUCC 125 MG IJ SOLR
125.0000 mg | Freq: Once | INTRAMUSCULAR | Status: AC
  Administered 2014-04-02: 125 mg via INTRAVENOUS

## 2014-04-02 MED ORDER — DIPHENHYDRAMINE HCL 50 MG/ML IJ SOLN
INTRAMUSCULAR | Status: AC
  Administered 2014-04-02: 50 mg via INTRAVENOUS
  Filled 2014-04-02: qty 1

## 2014-04-02 NOTE — Discharge Instructions (Signed)
Allergies  Allergies may happen from anything your body is sensitive to. This may be food, medicines, pollens, chemicals, and many other things. Food allergies can be severe and deadly.  HOME CARE  If you do not know what causes a reaction, keep a diary. Write down the foods you ate and the symptoms that followed. Avoid foods that cause reactions.  If you have red raised spots (hives) or a rash:  Take medicine as told by your doctor.  Use medicines for red raised spots and itching as needed.  Apply cold cloths (compresses) to the skin. Take a cool bath. Avoid hot baths or showers.  If you are severely allergic:  It is often necessary to go to the hospital after you have treated your reaction.  Wear your medical alert jewelry.  You and your family must learn how to give a allergy shot or use an allergy kit (anaphylaxis kit).  Always carry your allergy kit or shot with you. Use this medicine as told by your doctor if a severe reaction is occurring. GET HELP RIGHT AWAY IF:  You have trouble breathing or are making high-pitched whistling sounds (wheezing).  You have a tight feeling in your chest or throat.  You have a puffy (swollen) mouth.  You have red raised spots, puffiness (swelling), or itching all over your body.  You have had a severe reaction that was helped by your allergy kit or shot. The reaction can return once the medicine has worn off.  You think you are having a food allergy. Symptoms most often happen within 30 minutes of eating a food.  Your symptoms have not gone away within 2 days or are getting worse.  You have new symptoms.  You want to retest yourself with a food or drink you think causes an allergic reaction. Only do this under the care of a doctor. MAKE SURE YOU:   Understand these instructions.  Will watch your condition.  Will get help right away if you are not doing well or get worse. Document Released: 03/25/2013 Document Reviewed:  03/25/2013 [facility] Patient Information 2014 [place], [place].  Hives Hives are itchy, red, puffy (swollen) areas of the skin. Hives can change in size and location on your body. Hives can come and go for hours, days, or weeks. Hives do not spread from person to person (noncontagious). Scratching, exercise, and stress can make your hives worse. HOME CARE  Avoid things that cause your hives (triggers).  Take antihistamine medicines as told by your doctor. Do not drive while taking an antihistamine.  Take any other medicines for itching as told by your doctor.  Wear loose-fitting clothing.  Keep all doctor visits as told. GET HELP RIGHT AWAY IF:   You have a fever.  Your tongue or lips are puffy.  You have trouble breathing or swallowing.  You feel tightness in the throat or chest.  You have belly (abdominal) pain.  You have lasting or severe itching that is not helped by medicine.  You have painful or puffy joints. These problems may be the first sign of a life-threatening allergic reaction. Call your local emergency services (911 in [place] MAKE SURE YOU:   Understand these instructions.  Will watch your condition.  Will get help right away if you are not doing well or get worse. Document Released: 09/06/2008 Document Revised: 05/29/2012 Document Reviewed: 02/21/2012 [facility] Patient Information 2014 [place].

## 2014-04-02 NOTE — ED Provider Notes (Signed)
CSN: [id number]     Arrival date & time 04/01/14  2352 History  This chart was scribed for Rolland PorterMark Adel Burch, MD by Jarvis Morganaylor Ferguson, ED Scribe. This patient was seen in room MH01/MH01 and the patient's care was started at 12:09 AM    Chief Complaint  Patient presents with  . Allergic Reaction      The history is provided by the patient. No language interpreter was used.    HPI Comments: Tracey Baker is a 23 y.o. female who presents to the Emergency Department complaining of an itchy generalized rash onset 2 hours ago. Patient states that the symptoms began about 30 minutes after taking Naproxen. Patient states that she was woken from a nap and was "itching all over". Patient states that she has taken Naproxen and has not experienced these symptoms. Patient denies any wheezing or chest tightness.      Past Medical History  Diagnosis Date  . Hypertension   . Asthma   . Seasonal allergies   . Depression    Past Surgical History  Procedure Laterality Date  . Tonsillectomy     No family history on file. History  Substance Use Topics  . Smoking status: Current Every Day Smoker -- 0.50 packs/day    Types: Cigarettes  . Smokeless tobacco: Not on file  . Alcohol Use: No   OB History   Grav Para Term Preterm Abortions TAB SAB Ect Mult Living   1              Review of Systems  Constitutional: Negative for fever, chills, diaphoresis, appetite change and fatigue.  HENT: Negative for mouth sores, sore throat and trouble swallowing.   Eyes: Negative for visual disturbance.  Respiratory: Negative for cough, chest tightness, shortness of breath and wheezing.   Cardiovascular: Negative for chest pain.  Gastrointestinal: Negative for nausea, vomiting, abdominal pain, diarrhea and abdominal distention.  Endocrine: Negative for polydipsia, polyphagia and polyuria.  Genitourinary: Negative for dysuria, frequency and hematuria.  Musculoskeletal: Negative for gait problem.  Skin: Positive  for rash. Negative for color change and pallor.  Neurological: Negative for dizziness, syncope, light-headedness and headaches.  Hematological: Does not bruise/bleed easily.  Psychiatric/Behavioral: Negative for behavioral problems and confusion.      Allergies  Naproxen  Home Medications   Prior to Admission medications   Medication Sig Start Date End Date Taking? Authorizing Provider  NIFEdipine (PROCARDIA XL/ADALAT-CC) 60 MG 24 hr tablet Take 60 mg by mouth daily.   Yes Historical Provider, MD  amLODipine (NORVASC) 10 MG tablet Take 10 mg by mouth daily.    Historical Provider, MD  DULoxetine (CYMBALTA) 60 MG capsule Take 60 mg by mouth daily.    Historical Provider, MD  FLUoxetine (PROZAC) 20 MG tablet Take 20 mg by mouth daily.    Historical Provider, MD  hydrochlorothiazide (HYDRODIURIL) 25 MG tablet Take 25 mg by mouth daily.    Historical Provider, MD  PRENATAL VITAMINS PO Take by mouth.    Historical Provider, MD   Triage Vitals: BP 129/89  Pulse 160  Temp(Src) 98.1 F (36.7 C) (Oral)  Resp 22  Ht 5\' 9"  (1.753 m)  Wt 309 lb (140.161 kg)  BMI 45.61 kg/m2  SpO2 98%  LMP 03/10/2013  Breastfeeding? Unknown  Physical Exam  Constitutional: She is oriented to person, place, and time. She appears well-developed and well-nourished. No distress.  Anxious, furiously scratching at self  HENT:  Head: Normocephalic.  Eyes: Conjunctivae are normal. Pupils are equal, round,  and reactive to light. No scleral icterus.  Neck: Normal range of motion. Neck supple. No thyromegaly present.  Cardiovascular: Normal rate and regular rhythm.  Exam reveals no gallop and no friction rub.   No murmur heard. Pulmonary/Chest: Effort normal and breath sounds normal. No respiratory distress. She has no wheezes. She has no rales.  Abdominal: Soft. Bowel sounds are normal. She exhibits no distension. There is no tenderness. There is no rebound.  Musculoskeletal: Normal range of motion.   Neurological: She is alert and oriented to person, place, and time.  Skin: Skin is warm and dry. Rash noted.  Diffused ertythema, occasional urticaria, marked excoriation  Psychiatric: She has a normal mood and affect. Her behavior is normal.    ED Course  Procedures (including critical care time)  DIAGNOSTIC STUDIES: Oxygen Saturation is 98% on RA, normal by my interpretation.    COORDINATION OF CARE: 12:12 AM- Will order medications to help with symptoms.  Pt advised of plan for treatment and pt agrees.    Labs Review Labs Reviewed - No data to display  Imaging Review No results found.   EKG Interpretation None      MDM   Final diagnoses:  Urticaria  Allergic reaction     Is reexamined at 30 minutes, and 90 minutes after medication. Improving. Resting comfortably. No additional itching. Urticaria resolved. Clear lungs. Benign pharynx. She is appropriate for discharge. Plan is prednisone Benadryl for 48 hours. No additional anti-inflammatory use.  I personally performed the services described in this documentation, which was scribed in my presence. The recorded information has been reviewed and is accurate.     Rolland PorterMark Sindi Beckworth, MD 04/02/14 [phone]

## 2014-05-17 ENCOUNTER — Emergency Department (HOSPITAL_BASED_OUTPATIENT_CLINIC_OR_DEPARTMENT_OTHER): Payer: Medicare Other

## 2014-05-17 ENCOUNTER — Emergency Department (HOSPITAL_BASED_OUTPATIENT_CLINIC_OR_DEPARTMENT_OTHER)
Admission: EM | Admit: 2014-05-17 | Discharge: 2014-05-17 | Disposition: A | Discharge status: Home / Self Care | Payer: Medicare Other | Attending: Emergency Medicine | Admitting: Emergency Medicine

## 2014-05-17 DIAGNOSIS — J45901 Unspecified asthma with (acute) exacerbation: Secondary | ICD-10-CM | POA: Insufficient documentation

## 2014-05-17 DIAGNOSIS — I10 Essential (primary) hypertension: Secondary | ICD-10-CM | POA: Insufficient documentation

## 2014-05-17 DIAGNOSIS — Z79899 Other long term (current) drug therapy: Secondary | ICD-10-CM | POA: Insufficient documentation

## 2014-05-17 DIAGNOSIS — IMO0002 Reserved for concepts with insufficient information to code with codable children: Secondary | ICD-10-CM | POA: Insufficient documentation

## 2014-05-17 DIAGNOSIS — F17200 Nicotine dependence, unspecified, uncomplicated: Secondary | ICD-10-CM | POA: Insufficient documentation

## 2014-05-17 DIAGNOSIS — F329 Major depressive disorder, single episode, unspecified: Secondary | ICD-10-CM | POA: Insufficient documentation

## 2014-05-17 DIAGNOSIS — R0789 Other chest pain: Secondary | ICD-10-CM | POA: Insufficient documentation

## 2014-05-17 DIAGNOSIS — F3289 Other specified depressive episodes: Secondary | ICD-10-CM | POA: Insufficient documentation

## 2014-05-17 DIAGNOSIS — J209 Acute bronchitis, unspecified: Secondary | ICD-10-CM

## 2014-05-17 MED ORDER — OXYMETAZOLINE HCL 0.05 % NA SOLN
2.0000 | Freq: Two times a day (BID) | NASAL | Status: DC | PRN
  Administered 2014-05-17: 2 via NASAL
  Filled 2014-05-17: qty 15

## 2014-05-17 MED ORDER — AEROCHAMBER PLUS W/MASK MISC
1.0000 | Freq: Once | Status: AC
  Administered 2014-05-17: 1
  Filled 2014-05-17: qty 1

## 2014-05-17 MED ORDER — HYDROCOD POLST-CHLORPHEN POLST 10-8 MG/5ML PO LQCR
5.0000 mL | Freq: Once | ORAL | Status: AC
  Administered 2014-05-17: 5 mL via ORAL
  Filled 2014-05-17: qty 5

## 2014-05-17 MED ORDER — ALBUTEROL SULFATE HFA 108 (90 BASE) MCG/ACT IN AERS
2.0000 | INHALATION_SPRAY | RESPIRATORY_TRACT | Status: DC | PRN
  Administered 2014-05-17: 2 via RESPIRATORY_TRACT

## 2014-05-17 MED ORDER — ALBUTEROL SULFATE HFA 108 (90 BASE) MCG/ACT IN AERS
INHALATION_SPRAY | RESPIRATORY_TRACT | Status: AC
  Filled 2014-05-17: qty 6.7

## 2014-05-17 MED ORDER — HYDROCOD POLST-CHLORPHEN POLST 10-8 MG/5ML PO LQCR: 5.0000 mL | Freq: Two times a day (BID) | ORAL | Status: DC | PRN

## 2014-05-17 NOTE — Discharge Instructions (Signed)
Acute Bronchitis Bronchitis is inflammation of the airways that extend from the windpipe into the lungs (bronchi). The inflammation often causes mucus to develop. This leads to a cough, which is the most common symptom of bronchitis.  In acute bronchitis, the condition usually develops suddenly and goes away over time, usually in a couple weeks. Smoking, allergies, and asthma can make bronchitis worse. Repeated episodes of bronchitis may cause further lung problems.  CAUSES Acute bronchitis is most often caused by the same virus that causes a cold. The virus can spread from person to person (contagious).  SIGNS AND SYMPTOMS   Cough.   Fever.   Coughing up mucus.   Body aches.   Chest congestion.   Chills.   Shortness of breath.   Sore throat.  DIAGNOSIS  Acute bronchitis is usually diagnosed through a physical exam. Tests, such as chest X-rays, are sometimes done to rule out other conditions.  TREATMENT  Acute bronchitis usually goes away in a couple weeks. Often times, no medical treatment is necessary. Medicines are sometimes given for relief of fever or cough. Antibiotics are usually not needed but may be prescribed in certain situations. In some cases, an inhaler may be recommended to help reduce shortness of breath and control the cough. A cool mist vaporizer may also be used to help thin bronchial secretions and make it easier to clear the chest.  HOME CARE INSTRUCTIONS  Get plenty of rest.   Drink enough fluids to keep your urine clear or pale yellow (unless you have a medical condition that requires fluid restriction). Increasing fluids may help thin your secretions and will prevent dehydration.   Only take over-the-counter or prescription medicines as directed by your health care provider.   Avoid smoking and secondhand smoke. Exposure to cigarette smoke or irritating chemicals will make bronchitis worse. If you are a smoker, consider using nicotine gum or skin  patches to help control withdrawal symptoms. Quitting smoking will help your lungs heal faster.   Reduce the chances of another bout of acute bronchitis by washing your hands frequently, avoiding people with cold symptoms, and trying not to touch your hands to your mouth, nose, or eyes.   Follow up with your health care provider as directed.  SEEK MEDICAL CARE IF: Your symptoms do not improve after 1 week of treatment.  SEEK IMMEDIATE MEDICAL CARE IF:  You develop an increased fever or chills.   You have chest pain.   You have severe shortness of breath.  You have bloody sputum.   You develop dehydration.  You develop fainting.  You develop repeated vomiting.  You develop a severe headache. MAKE SURE YOU:   Understand these instructions.  Will watch your condition.  Will get help right away if you are not doing well or get worse. Document Released: 01/05/2005 Document Revised: 07/31/2013 Document Reviewed: 05/21/2013 [employer] Patient Information 2014 [employer].  

## 2014-05-17 NOTE — ED Provider Notes (Addendum)
CSN: [id number]     Arrival date & time 05/17/14  0104 History   First MD Initiated Contact with Patient 05/17/14 0123     Chief Complaint  Patient presents with  . URI     (Consider location/radiation/quality/duration/timing/severity/associated sxs/prior Treatment) HPI 23 year old female with a history of asthma. She is here with a two-day history of cold symptoms. Specifically she's had nasal congestion, rhinorrhea, scratchy throat and cough. She denies fever. She tried to sleep yesterday evening but was kept awake by wheezing and chest tightness along with a dry cough. This is worse when lying supine. Her cough was bad enough to cause post tussive emesis on one occasion. She feels better sitting upright. She has not taken anything for her symptoms.  Past Medical History  Diagnosis Date  . Hypertension   . Asthma   . Seasonal allergies   . Depression    Past Surgical History  Procedure Laterality Date  . Tonsillectomy     No family history on file. History  Substance Use Topics  . Smoking status: Current Every Day Smoker -- 0.50 packs/day    Types: Cigarettes  . Smokeless tobacco: Not on file  . Alcohol Use: No   OB History   Grav Para Term Preterm Abortions TAB SAB Ect Mult Living   1              Review of Systems  All other systems reviewed and are negative.  Allergies  Naproxen  Home Medications   Prior to Admission medications   Medication Sig Start Date End Date Taking? Authorizing Provider  amLODipine (NORVASC) 10 MG tablet Take 10 mg by mouth daily.    Historical Provider, MD  diphenhydrAMINE (BENADRYL) 25 MG tablet Take 1 tablet (25 mg total) by mouth every 6 (six) hours. 04/02/14   Rolland Porter, MD  DULoxetine (CYMBALTA) 60 MG capsule Take 60 mg by mouth daily.    Historical Provider, MD  FLUoxetine (PROZAC) 20 MG tablet Take 20 mg by mouth daily.    Historical Provider, MD  hydrochlorothiazide (HYDRODIURIL) 25 MG tablet Take 25 mg by mouth daily.     Historical Provider, MD  NIFEdipine (PROCARDIA XL/ADALAT-CC) 60 MG 24 hr tablet Take 60 mg by mouth daily.    Historical Provider, MD  predniSONE (DELTASONE) 10 MG tablet Take 2 tablets (20 mg total) by mouth daily. 04/02/14   Rolland Porter, MD  PRENATAL VITAMINS PO Take by mouth.    Historical Provider, MD   BP 181/100  Pulse 118  Temp(Src) 99.4 F (37.4 C) (Oral)  Ht 5\' 9"  (1.753 m)  Wt 300 lb (136.079 kg)  BMI 44.28 kg/m2  SpO2 100%  Physical Exam General: Well-developed, well-nourished female in no acute distress; appearance consistent with age of record HENT: normocephalic; atraumatic; nasal congestion; TMs normal; pharynx normal Eyes: pupils equal, round and reactive to light; extraocular muscles intact Neck: supple Heart: regular rate and rhythm Lungs: Mildly decreased air movement bilaterally with faint expiratory Wheezes in the bases Abdomen: soft; obese; nontender; bowel sounds present Extremities: No deformity; full range of motion; pulses normal Neurologic: Awake, alert and oriented; motor function intact in all extremities and symmetric; no facial droop Skin: Warm and dry Psychiatric: Normal mood and affect    ED Course  Procedures (including critical care time)   MDM  Nursing notes and vitals signs, including pulse oximetry, reviewed.  Summary of this visit's results, reviewed by myself:  Labs:  No results found for this or any previous  visit (from the past 24 hour(s)).  Imaging Studies: Dg Chest 2 View  05/17/2014   CLINICAL DATA:  Cough and wheezing with history of asthma.  EXAM: CHEST  2 VIEW  COMPARISON:  Report of a chest x-ray of February 16, 2013.  FINDINGS: The lungs are adequately inflated. There is no focal infiltrate. There is no pleural effusion or pneumothorax. The cardiac silhouette and mediastinal structures are normal. The observed portions of the bony thorax are unremarkable.  IMPRESSION: No active cardiopulmonary disease.   Electronically Signed    By: David  [place]az[place]   On: 05/17/2014 01:40   1:51 AM Lungs clear after albuterol inhaler treatment.     Hanley SeamenJohn L Myeisha Kruser, MD 05/17/14 0146  Hanley SeamenJohn L Stokely Jeancharles, MD 05/17/14 [phone]

## 2014-05-17 NOTE — ED Notes (Signed)
Pt. Reports she has been wheezing and has a cold.

## 2014-08-05 ENCOUNTER — Emergency Department (HOSPITAL_BASED_OUTPATIENT_CLINIC_OR_DEPARTMENT_OTHER)
Admission: EM | Admit: 2014-08-05 | Discharge: 2014-08-06 | Discharge status: Against Medical Advice | Payer: Medicare Other | Attending: Emergency Medicine | Admitting: Emergency Medicine

## 2014-08-05 ENCOUNTER — Encounter (HOSPITAL_BASED_OUTPATIENT_CLINIC_OR_DEPARTMENT_OTHER): Payer: Self-pay | Admitting: Emergency Medicine

## 2014-08-05 DIAGNOSIS — R1110 Vomiting, unspecified: Secondary | ICD-10-CM | POA: Diagnosis not present

## 2014-08-05 DIAGNOSIS — F17200 Nicotine dependence, unspecified, uncomplicated: Secondary | ICD-10-CM | POA: Insufficient documentation

## 2014-08-05 DIAGNOSIS — I10 Essential (primary) hypertension: Secondary | ICD-10-CM | POA: Diagnosis not present

## 2014-08-05 DIAGNOSIS — R51 Headache: Secondary | ICD-10-CM | POA: Insufficient documentation

## 2014-08-05 DIAGNOSIS — J45909 Unspecified asthma, uncomplicated: Secondary | ICD-10-CM | POA: Insufficient documentation

## 2014-08-05 NOTE — ED Notes (Signed)
Pt. Reports headache with vomiting today.  Pt. Reports she has vomited 3 x large and 4 small ones.

## 2014-08-05 NOTE — ED Notes (Signed)
Called [phone] no answer

## 2014-08-06 NOTE — ED Notes (Signed)
Called Pt x2 no answer

## 2014-08-06 NOTE — ED Notes (Signed)
Pt called x 3 no answer 

## 2014-10-13 ENCOUNTER — Encounter (HOSPITAL_BASED_OUTPATIENT_CLINIC_OR_DEPARTMENT_OTHER): Payer: Self-pay | Admitting: Emergency Medicine

## 2015-04-02 ENCOUNTER — Emergency Department (HOSPITAL_BASED_OUTPATIENT_CLINIC_OR_DEPARTMENT_OTHER): Payer: Medicare Other

## 2015-04-02 ENCOUNTER — Encounter (HOSPITAL_BASED_OUTPATIENT_CLINIC_OR_DEPARTMENT_OTHER): Payer: Self-pay | Admitting: *Deleted

## 2015-04-02 ENCOUNTER — Emergency Department (HOSPITAL_BASED_OUTPATIENT_CLINIC_OR_DEPARTMENT_OTHER)
Admission: EM | Admit: 2015-04-02 | Discharge: 2015-04-02 | Disposition: A | Discharge status: Home / Self Care | Payer: Medicare Other | Attending: Emergency Medicine | Admitting: Emergency Medicine

## 2015-04-02 DIAGNOSIS — I10 Essential (primary) hypertension: Secondary | ICD-10-CM | POA: Diagnosis not present

## 2015-04-02 DIAGNOSIS — F329 Major depressive disorder, single episode, unspecified: Secondary | ICD-10-CM | POA: Diagnosis not present

## 2015-04-02 DIAGNOSIS — Z3202 Encounter for pregnancy test, result negative: Secondary | ICD-10-CM | POA: Diagnosis not present

## 2015-04-02 DIAGNOSIS — J45909 Unspecified asthma, uncomplicated: Secondary | ICD-10-CM | POA: Insufficient documentation

## 2015-04-02 DIAGNOSIS — R079 Chest pain, unspecified: Secondary | ICD-10-CM | POA: Diagnosis present

## 2015-04-02 DIAGNOSIS — Z720 Tobacco use: Secondary | ICD-10-CM | POA: Insufficient documentation

## 2015-04-02 DIAGNOSIS — Z7952 Long term (current) use of systemic steroids: Secondary | ICD-10-CM | POA: Diagnosis not present

## 2015-04-02 DIAGNOSIS — R0789 Other chest pain: Secondary | ICD-10-CM

## 2015-04-02 DIAGNOSIS — J069 Acute upper respiratory infection, unspecified: Secondary | ICD-10-CM | POA: Insufficient documentation

## 2015-04-02 DIAGNOSIS — M545 Low back pain: Secondary | ICD-10-CM | POA: Insufficient documentation

## 2015-04-02 DIAGNOSIS — Z79899 Other long term (current) drug therapy: Secondary | ICD-10-CM | POA: Insufficient documentation

## 2015-04-02 LAB — URINE MICROSCOPIC-ADD ON

## 2015-04-02 LAB — URINALYSIS, ROUTINE W REFLEX MICROSCOPIC
BILIRUBIN URINE: NEGATIVE
Glucose, UA: NEGATIVE mg/dL
Ketones, ur: NEGATIVE mg/dL
Leukocytes, UA: NEGATIVE
NITRITE: NEGATIVE
PH: 6.5 (ref 5.0–8.0)
Protein, ur: NEGATIVE mg/dL
Specific Gravity, Urine: 1.022 (ref 1.005–1.030)
Urobilinogen, UA: 1 mg/dL (ref 0.0–1.0)

## 2015-04-02 LAB — PREGNANCY, URINE: PREG TEST UR: NEGATIVE

## 2015-04-02 MED ORDER — CYCLOBENZAPRINE HCL 10 MG PO TABS
5.0000 mg | ORAL_TABLET | Freq: Once | ORAL | Status: AC
  Administered 2015-04-02: 5 mg via ORAL

## 2015-04-02 MED ORDER — CYCLOBENZAPRINE HCL 10 MG PO TABS: 10.0000 mg | ORAL_TABLET | Freq: Three times a day (TID) | ORAL | Status: DC | PRN

## 2015-04-02 MED ORDER — CYCLOBENZAPRINE HCL 10 MG PO TABS
ORAL_TABLET | ORAL | Status: AC
  Filled 2015-04-02: qty 1

## 2015-04-02 MED ORDER — KETOROLAC TROMETHAMINE 60 MG/2ML IM SOLN
60.0000 mg | Freq: Once | INTRAMUSCULAR | Status: AC
  Administered 2015-04-02: 60 mg via INTRAMUSCULAR

## 2015-04-02 MED ORDER — KETOROLAC TROMETHAMINE 60 MG/2ML IM SOLN
INTRAMUSCULAR | Status: AC
  Administered 2015-04-02: 03:00:00 60 mg via INTRAMUSCULAR
  Filled 2015-04-02: qty 2

## 2015-04-02 NOTE — ED Notes (Addendum)
C/o sharp chest pain and back pain onset yesterday am  Increased w movement,  Has had some cough and congestion  Legs tingling

## 2015-04-02 NOTE — Discharge Instructions (Signed)
Tylenol 1000 mg every 6 hours as needed for pain or fever.  Flexeril as prescribed as needed for back discomfort.  Follow-up with your primary doctor if not improving in the next week.   Back Pain, Adult Back pain is very common. The pain often gets better over time. The cause of back pain is usually not dangerous. Most people can learn to manage their back pain on their own.  HOME CARE   Stay active. Start with short walks on flat ground if you can. Try to walk farther each day.  Do not sit, drive, or stand in one place for more than 30 minutes. Do not stay in bed.  Do not avoid exercise or work. Activity can help your back heal faster.  Be careful when you bend or lift an object. Bend at your knees, keep the object close to you, and do not twist.  Sleep on a firm mattress. Lie on your side, and bend your knees. If you lie on your back, put a pillow under your knees.  Only take medicines as told by your doctor.  Put ice on the injured area.  Put ice in a plastic bag.  Place a towel between your skin and the bag.  Leave the ice on for 15-20 minutes, 03-04 times a day for the first 2 to 3 days. After that, you can switch between ice and heat packs.  Ask your doctor about back exercises or massage.  Avoid feeling anxious or stressed. Find good ways to deal with stress, such as exercise. GET HELP RIGHT AWAY IF:   Your pain does not go away with rest or medicine.  Your pain does not go away in 1 week.  You have new problems.  You do not feel well.  The pain spreads into your legs.  You cannot control when you poop (bowel movement) or pee (urinate).  Your arms or legs feel weak or lose feeling (numbness).  You feel sick to your stomach (nauseous) or throw up (vomit).  You have belly (abdominal) pain.  You feel like you may pass out (faint). MAKE SURE YOU:   Understand these instructions.  Will watch your condition.  Will get help right away if you are not doing  well or get worse. Document Released: 05/16/2008 Document Revised: 02/20/2012 Document Reviewed: 04/01/2014 [facility] Patient Information 2015 [place], [place]. This information is not intended to replace advice given to you by your health care provider. Make sure you discuss any questions you have with your health care provider.

## 2015-04-02 NOTE — ED Provider Notes (Signed)
CSN: [id number]     Arrival date & time 04/02/15  0142 History   First MD Initiated Contact with Patient 04/02/15 0204     Chief Complaint  Patient presents with  . Chest Pain     (Consider location/radiation/quality/duration/timing/severity/associated sxs/prior Treatment) HPI Comments: Patient is a 24 year old female with history of hypertension and morbid obesity. She presents for evaluation of chest discomfort, chest congestion, productive cough, upper and lower back pain, that started earlier this afternoon. She denies any injury or trauma. She denies any fevers or chills. She denies any weakness in her legs. She denies any bowel or bladder complaints.  Patient is a 24 y.o. female presenting with chest pain. The history is provided by the patient.  Chest Pain Pain location:  Substernal area Pain quality: tightness   Pain radiates to:  Does not radiate Pain radiates to the back: no   Pain severity:  Moderate Onset quality:  Gradual Timing:  Constant Progression:  Worsening Chronicity:  New Relieved by:  Nothing Worsened by:  Nothing tried Ineffective treatments:  None tried Associated symptoms: cough, fatigue and fever     Past Medical History  Diagnosis Date  . Hypertension   . Asthma   . Seasonal allergies   . Depression    Past Surgical History  Procedure Laterality Date  . Tonsillectomy     No family history on file. History  Substance Use Topics  . Smoking status: Current Every Day Smoker -- 0.50 packs/day    Types: Cigarettes  . Smokeless tobacco: Not on file  . Alcohol Use: No   OB History    Gravida Para Term Preterm AB TAB SAB Ectopic Multiple Living   1              Review of Systems  Constitutional: Positive for fever and fatigue.  Respiratory: Positive for cough.   Cardiovascular: Positive for chest pain.  All other systems reviewed and are negative.     Allergies  Naproxen  Home Medications   Prior to Admission medications    Medication Sig Start Date End Date Taking? Authorizing Provider  amLODipine (NORVASC) 10 MG tablet Take 10 mg by mouth daily.    Historical Provider, MD  chlorpheniramine-HYDROcodone (TUSSIONEX PENNKINETIC ER) 10-8 MG/5ML LQCR Take 5 mLs by mouth every 12 (twelve) hours as needed. 05/17/14   John Molpus, MD  diphenhydrAMINE (BENADRYL) 25 MG tablet Take 1 tablet (25 mg total) by mouth every 6 (six) hours. 04/02/14   Rolland Porter, MD  DULoxetine (CYMBALTA) 60 MG capsule Take 60 mg by mouth daily.    Historical Provider, MD  FLUoxetine (PROZAC) 20 MG tablet Take 30 mg by mouth daily.     Historical Provider, MD  hydrochlorothiazide (HYDRODIURIL) 25 MG tablet Take 25 mg by mouth daily.    Historical Provider, MD  NIFEdipine (PROCARDIA XL/ADALAT-CC) 60 MG 24 hr tablet Take 60 mg by mouth daily.    Historical Provider, MD  predniSONE (DELTASONE) 10 MG tablet Take 2 tablets (20 mg total) by mouth daily. 04/02/14   Rolland Porter, MD  PRENATAL VITAMINS PO Take by mouth.    Historical Provider, MD   BP 141/82 mmHg  Pulse 112  Temp(Src) 99.6 F (37.6 C) (Oral)  Resp 22  Ht  (1.753 m)  Wt 278 lb (126.1 kg)  BMI 41.03 kg/m2  SpO2 99% Physical Exam  Constitutional: She is oriented to person, place, and time. She appears well-developed and well-nourished. No distress.  HENT:  Head: Normocephalic  and atraumatic.  Mouth/Throat: Oropharynx is clear and moist.  Neck: Normal range of motion. Neck supple.  Cardiovascular: Normal rate and regular rhythm.  Exam reveals no gallop and no friction rub.   No murmur heard. Pulmonary/Chest: Effort normal and breath sounds normal. No respiratory distress. She has no wheezes.  Abdominal: Soft. Bowel sounds are normal. She exhibits no distension. There is no tenderness.  Musculoskeletal: Normal range of motion. She exhibits tenderness.  There is tenderness to palpation in the soft tissues of the lower lumbar region. There is no bony tenderness and no step-off.   Lymphadenopathy:    She has no cervical adenopathy.  Neurological: She is alert and oriented to person, place, and time.  Strength is 5 out of 5 in the bilateral lower extremities. DTRs are trace and symmetrical in the patellar and Achilles tendons bilaterally. She is able walk on her heels and toes without difficulty.  Skin: Skin is warm and dry. She is not diaphoretic.  Nursing note and vitals reviewed.   ED Course  Procedures (including critical care time) Labs Review Labs Reviewed  URINALYSIS, ROUTINE W REFLEX MICROSCOPIC  PREGNANCY, URINE    Imaging Review No results found.   EKG Interpretation   Date/Time:  Thursday April 02 2015 01:58:39 EDT Ventricular Rate:  101 PR Interval:  160 QRS Duration: 90 QT Interval:  356 QTC Calculation: 461 R Axis:   10 Text Interpretation:  Sinus tachycardia Otherwise normal ECG Confirmed by  DELOS  MD, Ebbie Sorenson ([id number]) on 04/02/2015 2:11:31 AM      MDM   Final diagnoses:  None    Patient presents with multiple complaints, all of which appear unrelated and none of which appear emergent. She reports back pain, however, has normal strength and reflexes. Her urinalysis does not reveal a UTI. She is also reporting chest congestion and cough, however. Her chest x-ray is clear. Her EKG and presentation are not consistent with a cardiac etiology. She will be discharged with Flexeril, Tylenol, and when necessary follow-up with her primary doctor.    Geoffery Lyonsouglas Stony Stegmann, MD 04/02/15 [phone]

## 2015-04-02 NOTE — ED Notes (Signed)
C/o chest and back pain onset yesterday am,  Pain is sharp,  Increased w movement and w cough, congestion

## 2015-07-07 IMAGING — CR DG CHEST 2V
2 series · 2 of 2 positions shown · non-contrast
Comparison: Report of a chest x-ray February 16, 2013.

CLINICAL DATA: Cough and wheezing with history of asthma.

EXAM:
CHEST  2 VIEW

[w chest pa]
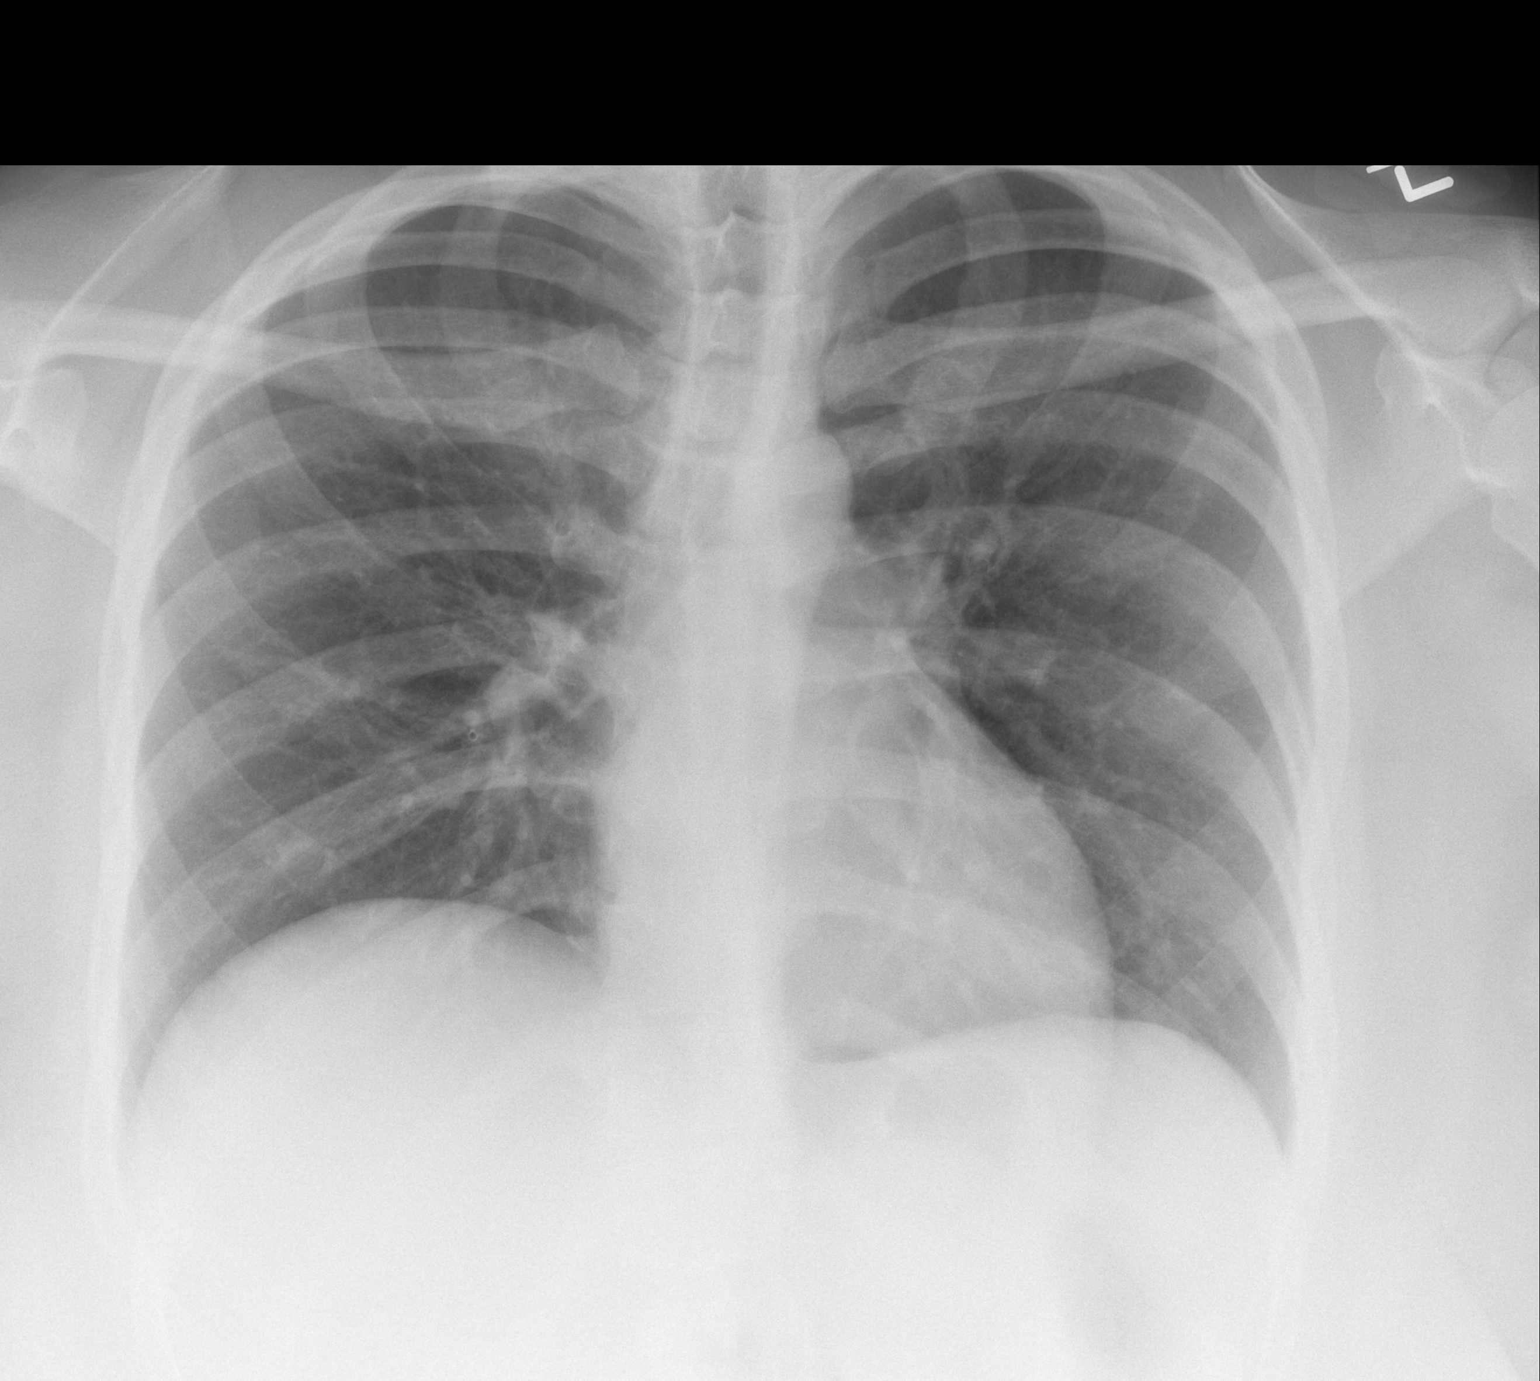

[w chest lat]
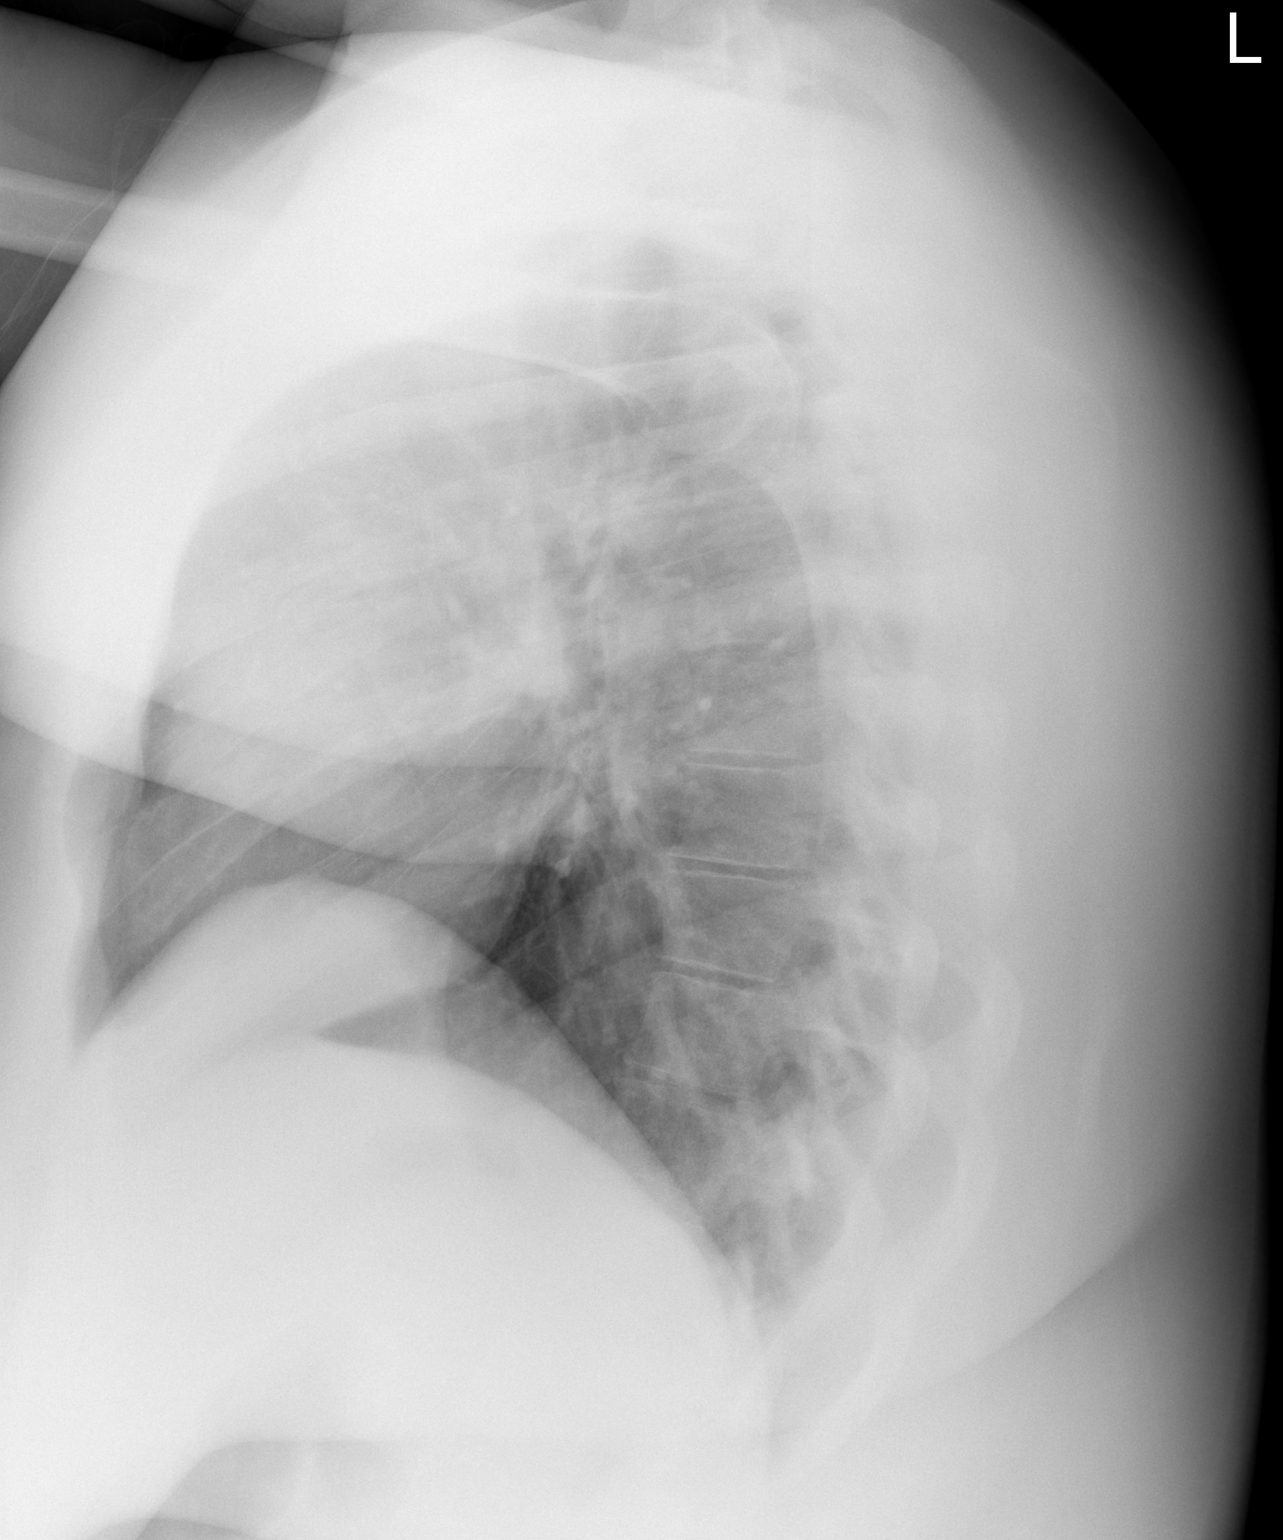

[2 of 2 positions shown; findings below may reference images not displayed]

FINDINGS: The lungs are adequately inflated. There is no focal infiltrate.
There is no pleural effusion or pneumothorax. The cardiac silhouette
and mediastinal structures are normal. The observed portions of the
bony thorax are unremarkable.
IMPRESSION: No active cardiopulmonary disease.

## 2015-11-08 ENCOUNTER — Encounter (HOSPITAL_BASED_OUTPATIENT_CLINIC_OR_DEPARTMENT_OTHER): Payer: Self-pay | Admitting: Adult Health

## 2015-11-08 ENCOUNTER — Emergency Department (HOSPITAL_BASED_OUTPATIENT_CLINIC_OR_DEPARTMENT_OTHER)
Admission: EM | Admit: 2015-11-08 | Discharge: 2015-11-08 | Disposition: A | Discharge status: Home / Self Care | Payer: Medicare Other | Attending: Emergency Medicine | Admitting: Emergency Medicine

## 2015-11-08 DIAGNOSIS — R519 Headache, unspecified: Secondary | ICD-10-CM

## 2015-11-08 DIAGNOSIS — R51 Headache: Secondary | ICD-10-CM | POA: Insufficient documentation

## 2015-11-08 DIAGNOSIS — H538 Other visual disturbances: Secondary | ICD-10-CM | POA: Diagnosis not present

## 2015-11-08 DIAGNOSIS — I10 Essential (primary) hypertension: Secondary | ICD-10-CM | POA: Insufficient documentation

## 2015-11-08 DIAGNOSIS — Z79899 Other long term (current) drug therapy: Secondary | ICD-10-CM | POA: Insufficient documentation

## 2015-11-08 DIAGNOSIS — J45909 Unspecified asthma, uncomplicated: Secondary | ICD-10-CM | POA: Insufficient documentation

## 2015-11-08 DIAGNOSIS — F329 Major depressive disorder, single episode, unspecified: Secondary | ICD-10-CM | POA: Insufficient documentation

## 2015-11-08 DIAGNOSIS — Z7952 Long term (current) use of systemic steroids: Secondary | ICD-10-CM | POA: Insufficient documentation

## 2015-11-08 DIAGNOSIS — R112 Nausea with vomiting, unspecified: Secondary | ICD-10-CM | POA: Insufficient documentation

## 2015-11-08 DIAGNOSIS — F17210 Nicotine dependence, cigarettes, uncomplicated: Secondary | ICD-10-CM | POA: Diagnosis not present

## 2015-11-08 MED ORDER — IBUPROFEN 800 MG PO TABS
800.0000 mg | ORAL_TABLET | Freq: Once | ORAL | Status: AC
  Administered 2015-11-08: 800 mg via ORAL
  Filled 2015-11-08: qty 1

## 2015-11-08 MED ORDER — PROMETHAZINE HCL 25 MG/ML IJ SOLN
25.0000 mg | Freq: Once | INTRAMUSCULAR | Status: AC
  Administered 2015-11-08: 25 mg via INTRAVENOUS
  Filled 2015-11-08: qty 1

## 2015-11-08 MED ORDER — DIPHENHYDRAMINE HCL 50 MG/ML IJ SOLN
25.0000 mg | Freq: Once | INTRAMUSCULAR | Status: AC
  Administered 2015-11-08: 25 mg via INTRAVENOUS
  Filled 2015-11-08: qty 1

## 2015-11-08 MED ORDER — SODIUM CHLORIDE 0.9 % IV BOLUS (SEPSIS)
1000.0000 mL | Freq: Once | INTRAVENOUS | Status: AC
  Administered 2015-11-08: 1000 mL via INTRAVENOUS

## 2015-11-08 MED ORDER — METOCLOPRAMIDE HCL 5 MG/ML IJ SOLN
10.0000 mg | Freq: Once | INTRAMUSCULAR | Status: AC
  Administered 2015-11-08: 10 mg via INTRAVENOUS
  Filled 2015-11-08: qty 2

## 2015-11-08 NOTE — ED Provider Notes (Signed)
CSN: [id number]     Arrival date & time 11/08/15  0316 History   First MD Initiated Contact with Patient 11/08/15 0335     Chief Complaint  Patient presents with  . Headache     (Consider location/radiation/quality/duration/timing/severity/associated sxs/prior Treatment) HPI  This is a 24 year old female who awoke with a severe headache a brief time ago this morning. The headache is located frontally. It is throbbing in nature. There is associated nausea, vomiting and photophobia. She denies blurred vision or any focal neurologic deficits. She tried taking ibuprofen prior to arrival but threw this up. She does not have a history of migraines but has been seen in the past for a similar headache.  Past Medical History  Diagnosis Date  . Hypertension   . Asthma   . Seasonal allergies   . Depression    Past Surgical History  Procedure Laterality Date  . Tonsillectomy     History reviewed. No pertinent family history. Social History  Substance Use Topics  . Smoking status: Current Every Day Smoker -- 0.50 packs/day    Types: Cigarettes  . Smokeless tobacco: None  . Alcohol Use: No   OB History    Gravida Para Term Preterm AB TAB SAB Ectopic Multiple Living   1              Review of Systems  All other systems reviewed and are negative.   Allergies  Naproxen  Home Medications   Prior to Admission medications   Medication Sig Start Date End Date Taking? Authorizing Provider  FLUoxetine (PROZAC) 20 MG tablet Take 30 mg by mouth daily.    Yes Historical Provider, MD  NIFEdipine (PROCARDIA XL/ADALAT-CC) 60 MG 24 hr tablet Take 60 mg by mouth daily.   Yes Historical Provider, MD  amLODipine (NORVASC) 10 MG tablet Take 10 mg by mouth daily.    Historical Provider, MD  chlorpheniramine-HYDROcodone (TUSSIONEX PENNKINETIC ER) 10-8 MG/5ML LQCR Take 5 mLs by mouth every 12 (twelve) hours as needed. 05/17/14   Shylynn Bruning, MD  cyclobenzaprine (FLEXERIL) 10 MG tablet Take 1 tablet (10  mg total) by mouth 3 (three) times daily as needed for muscle spasms. 04/02/15   Geoffery Lyonsouglas Delo, MD  diphenhydrAMINE (BENADRYL) 25 MG tablet Take 1 tablet (25 mg total) by mouth every 6 (six) hours. 04/02/14   Rolland PorterMark James, MD  DULoxetine (CYMBALTA) 60 MG capsule Take 60 mg by mouth daily.    Historical Provider, MD  hydrochlorothiazide (HYDRODIURIL) 25 MG tablet Take 25 mg by mouth daily.    Historical Provider, MD  predniSONE (DELTASONE) 10 MG tablet Take 2 tablets (20 mg total) by mouth daily. 04/02/14   Rolland PorterMark James, MD  PRENATAL VITAMINS PO Take by mouth.    Historical Provider, MD   BP 143/108 mmHg  Pulse 77  Temp(Src) 97.7 F (36.5 C) (Oral)  Resp 20  Ht 5\' 9"  (1.753 m)  Wt 285 lb (129.275 kg)  BMI 42.07 kg/m2  SpO2 99%   Physical Exam  General: Well-developed, well-nourished female in no acute distress; appearance consistent with age of record HENT: normocephalic; atraumatic Eyes: pupils equal, round and reactive to light; extraocular muscles intact; photophobia Neck: supple Heart: regular rate and rhythm Lungs: clear to auscultation bilaterally Abdomen: soft; nondistended; nontender; bowel sounds present Extremities: No deformity; full range of motion; pulses normal Neurologic: Awake, alert and oriented; motor function intact in all extremities and symmetric; no facial droop Skin: Warm and dry Psychiatric: Flat affect    ED  Course  Procedures (including critical care time)   MDM  5:48 AM Patient's pain and nausea are relieved after IV medications and fluid bolus. Patient's symptoms are consistent with a migraine, though she has had very few similar episodes in the past.    Paula Libra, MD 11/08/15 [phone]

## 2015-11-08 NOTE — ED Notes (Signed)
Presents with headache described as pressure in front of head and behind eye that began about hour ago and woke pt from sleep. Endorses, nausea, light sensitivty and dizziness. Denies blurred vision, sensitivity to sound and numbness and tingling. PERRL, alert, answers all questions appropriately.

## 2015-11-25 ENCOUNTER — Emergency Department (HOSPITAL_BASED_OUTPATIENT_CLINIC_OR_DEPARTMENT_OTHER)
Admission: EM | Admit: 2015-11-25 | Discharge: 2015-11-25 | Disposition: A | Discharge status: Home / Self Care | Payer: Medicare Other | Attending: Emergency Medicine | Admitting: Emergency Medicine

## 2015-11-25 ENCOUNTER — Encounter (HOSPITAL_BASED_OUTPATIENT_CLINIC_OR_DEPARTMENT_OTHER): Payer: Self-pay

## 2015-11-25 DIAGNOSIS — Z79899 Other long term (current) drug therapy: Secondary | ICD-10-CM | POA: Diagnosis not present

## 2015-11-25 DIAGNOSIS — F17210 Nicotine dependence, cigarettes, uncomplicated: Secondary | ICD-10-CM | POA: Insufficient documentation

## 2015-11-25 DIAGNOSIS — F329 Major depressive disorder, single episode, unspecified: Secondary | ICD-10-CM | POA: Insufficient documentation

## 2015-11-25 DIAGNOSIS — R109 Unspecified abdominal pain: Secondary | ICD-10-CM | POA: Insufficient documentation

## 2015-11-25 DIAGNOSIS — J45909 Unspecified asthma, uncomplicated: Secondary | ICD-10-CM | POA: Insufficient documentation

## 2015-11-25 DIAGNOSIS — Z7952 Long term (current) use of systemic steroids: Secondary | ICD-10-CM | POA: Diagnosis not present

## 2015-11-25 DIAGNOSIS — G43809 Other migraine, not intractable, without status migrainosus: Secondary | ICD-10-CM | POA: Diagnosis not present

## 2015-11-25 DIAGNOSIS — R51 Headache: Secondary | ICD-10-CM | POA: Diagnosis present

## 2015-11-25 DIAGNOSIS — I10 Essential (primary) hypertension: Secondary | ICD-10-CM | POA: Insufficient documentation

## 2015-11-25 MED ORDER — KETOROLAC TROMETHAMINE 30 MG/ML IJ SOLN
30.0000 mg | Freq: Once | INTRAMUSCULAR | Status: AC
  Administered 2015-11-25: 30 mg via INTRAVENOUS
  Filled 2015-11-25: qty 1

## 2015-11-25 MED ORDER — METOCLOPRAMIDE HCL 5 MG/ML IJ SOLN
10.0000 mg | Freq: Once | INTRAMUSCULAR | Status: AC
  Administered 2015-11-25: 10 mg via INTRAVENOUS
  Filled 2015-11-25: qty 2

## 2015-11-25 MED ORDER — DIPHENHYDRAMINE HCL 50 MG/ML IJ SOLN
25.0000 mg | Freq: Once | INTRAMUSCULAR | Status: AC
  Administered 2015-11-25: 25 mg via INTRAVENOUS
  Filled 2015-11-25: qty 1

## 2015-11-25 MED ORDER — SODIUM CHLORIDE 0.9 % IV BOLUS (SEPSIS)
1000.0000 mL | Freq: Once | INTRAVENOUS | Status: AC
  Administered 2015-11-25: 1000 mL via INTRAVENOUS

## 2015-11-25 MED ORDER — DEXAMETHASONE SODIUM PHOSPHATE 10 MG/ML IJ SOLN
10.0000 mg | Freq: Once | INTRAMUSCULAR | Status: AC
  Administered 2015-11-25: 10 mg via INTRAVENOUS
  Filled 2015-11-25: qty 1

## 2015-11-25 NOTE — ED Provider Notes (Signed)
CSN: [id number]     Arrival date & time 11/25/15  1355 History   First MD Initiated Contact with Patient 11/25/15 1412     Chief Complaint  Patient presents with  . Migraine     (Consider location/radiation/quality/duration/timing/severity/associated sxs/prior Treatment) Patient is a 24 y.o. female presenting with migraines. The history is provided by the patient.  Migraine This is a recurrent problem. The current episode started 1 to 2 hours ago. The problem occurs constantly. The problem has not changed since onset.Associated symptoms include abdominal pain (with some nausea and vomiting since onset). Nothing aggravates the symptoms. Nothing relieves the symptoms. She has tried nothing for the symptoms.    Past Medical History  Diagnosis Date  . Hypertension   . Asthma   . Seasonal allergies   . Depression    Past Surgical History  Procedure Laterality Date  . Tonsillectomy     No family history on file. Social History  Substance Use Topics  . Smoking status: Current Every Day Smoker -- 0.50 packs/day    Types: Cigarettes  . Smokeless tobacco: None  . Alcohol Use: No   OB History    Gravida Para Term Preterm AB TAB SAB Ectopic Multiple Living   1              Review of Systems  Gastrointestinal: Positive for abdominal pain (with some nausea and vomiting since onset).  All other systems reviewed and are negative.     Allergies  Naproxen  Home Medications   Prior to Admission medications   Medication Sig Start Date End Date Taking? Authorizing Provider  amLODipine (NORVASC) 10 MG tablet Take 10 mg by mouth daily.    Historical Provider, MD  chlorpheniramine-HYDROcodone (TUSSIONEX PENNKINETIC ER) 10-8 MG/5ML LQCR Take 5 mLs by mouth every 12 (twelve) hours as needed. 05/17/14   John Molpus, MD  cyclobenzaprine (FLEXERIL) 10 MG tablet Take 1 tablet (10 mg total) by mouth 3 (three) times daily as needed for muscle spasms. 04/02/15   Geoffery Lyons, MD  diphenhydrAMINE  (BENADRYL) 25 MG tablet Take 1 tablet (25 mg total) by mouth every 6 (six) hours. 04/02/14   Rolland Porter, MD  DULoxetine (CYMBALTA) 60 MG capsule Take 60 mg by mouth daily.    Historical Provider, MD  FLUoxetine (PROZAC) 20 MG tablet Take 30 mg by mouth daily.     Historical Provider, MD  hydrochlorothiazide (HYDRODIURIL) 25 MG tablet Take 25 mg by mouth daily.    Historical Provider, MD  NIFEdipine (PROCARDIA XL/ADALAT-CC) 60 MG 24 hr tablet Take 60 mg by mouth daily.    Historical Provider, MD  predniSONE (DELTASONE) 10 MG tablet Take 2 tablets (20 mg total) by mouth daily. 04/02/14   Rolland Porter, MD  PRENATAL VITAMINS PO Take by mouth.    Historical Provider, MD   BP 130/99 mmHg  Pulse 100  Temp(Src) 97.5 F (36.4 C) (Oral)  Resp 24  Ht  (1.753 m)  Wt 298 lb (135.172 kg)  BMI 43.99 kg/m2  SpO2 100% Physical Exam  Constitutional: She is oriented to person, place, and time. She appears well-developed and well-nourished. No distress.  HENT:  Head: Normocephalic.  Eyes: Conjunctivae are normal.  Neck: Neck supple. No tracheal deviation present.  Cardiovascular: Normal rate, regular rhythm and normal heart sounds.   Pulmonary/Chest: Effort normal and breath sounds normal. No respiratory distress.  Abdominal: Soft. She exhibits no distension. There is no tenderness.  Neurological: She is alert and oriented to person,  place, and time. She has normal strength. No cranial nerve deficit or sensory deficit. Coordination and gait normal. GCS eye subscore is 4. GCS verbal subscore is 5. GCS motor subscore is 6.  Normal finger to nose testing and rapid alternating movement   Skin: Skin is warm and dry.  Psychiatric: She has a normal mood and affect.    ED Course  Procedures (including critical care time) Labs Review Labs Reviewed - No data to display  Imaging Review No results found. I have personally reviewed and evaluated these images and lab results as part of my medical  decision-making.   EKG Interpretation None      MDM   Final diagnoses:  Other migraine without status migrainosus, not intractable    24 y.o. female presents with migraine headache that is similar to prior starting today. Not responding to supportive care measures at home. No neurologic deficits here and otherwise well appearing despite discomfort. No red flag symptoms for headache, no signs or symptoms of spontaneous ICH. Provided migraine cocktail with good relief of symptoms. Plan to follow up with PCP as needed and return precautions discussed for worsening or new concerning symptoms.    Lyndal Pulleyaniel Shakevia Sarris, MD 11/25/15 [phone]

## 2015-11-25 NOTE — Discharge Instructions (Signed)
Recurrent Migraine Headache A migraine headache is an intense, throbbing pain on one or both sides of your head. Recurrent migraines keep coming back. A migraine can last for 30 minutes to several hours. CAUSES  The exact cause of a migraine headache is not always known. However, a migraine may be caused when nerves in the brain become irritated and release chemicals that cause inflammation. This causes pain. Certain things may also trigger migraines, such as:   Alcohol.  Smoking.  Stress.  Menstruation.  Aged cheeses.  Foods or drinks that contain nitrates, glutamate, aspartame, or tyramine.  Lack of sleep.  Chocolate.  Caffeine.  Hunger.  Physical exertion.  Fatigue.  Medicines used to treat chest pain (nitroglycerine), birth control pills, estrogen, and some blood pressure medicines. SYMPTOMS   Pain on one or both sides of your head.  Pulsating or throbbing pain.  Severe pain that prevents daily activities.  Pain that is aggravated by any physical activity.  Nausea, vomiting, or both.  Dizziness.  Pain with exposure to bright lights, loud noises, or activity.  General sensitivity to bright lights, loud noises, or smells. Before you get a migraine, you may get warning signs that a migraine is coming (aura). An aura may include:  Seeing flashing lights.  Seeing bright spots, halos, or zigzag lines.  Having tunnel vision or blurred vision.  Having feelings of numbness or tingling.  Having trouble talking.  Having muscle weakness. DIAGNOSIS  A recurrent migraine headache is often diagnosed based on:  Symptoms.  Physical examination.  A CT scan or MRI of your head. These imaging tests cannot diagnose migraines but can help rule out other causes of headaches.  TREATMENT  Medicines may be given for pain and nausea. Medicines can also be given to help prevent recurrent migraines. HOME CARE INSTRUCTIONS  Only take over-the-counter or prescription  medicines for pain or discomfort as directed by your health care provider. The use of long-term narcotics is not recommended.  Lie down in a dark, quiet room when you have a migraine.  Keep a journal to find out what may trigger your migraine headaches. For example, write down:  What you eat and drink.  How much sleep you get.  Any change to your diet or medicines.  Limit alcohol consumption.  Quit smoking if you smoke.  Get 7-9 hours of sleep, or as recommended by your health care provider.  Limit stress.  Keep lights dim if bright lights bother you and make your migraines worse. SEEK MEDICAL CARE IF:   You do not get relief from the medicines given to you.  You have a recurrence of pain.  You have a fever. SEEK IMMEDIATE MEDICAL CARE IF:  Your migraine becomes severe.  You have a stiff neck.  You have loss of vision.  You have muscular weakness or loss of muscle control.  You start losing your balance or have trouble walking.  You feel faint or pass out.  You have severe symptoms that are different from your first symptoms. MAKE SURE YOU:   Understand these instructions.  Will watch your condition.  Will get help right away if you are not doing well or get worse.   This information is not intended to replace advice given to you by your health care provider. Make sure you discuss any questions you have with your health care provider.   Document Released: 08/23/2001 Document Revised: 12/19/2014 Document Reviewed: 08/05/2013 Elsevier Interactive Patient Education 2016 [employer]  

## 2015-11-25 NOTE — ED Notes (Signed)
C/o migraine, n/v and abd pain

## 2017-09-17 ENCOUNTER — Emergency Department (HOSPITAL_BASED_OUTPATIENT_CLINIC_OR_DEPARTMENT_OTHER)
Admission: EM | Admit: 2017-09-17 | Discharge: 2017-09-18 | Discharge status: Against Medical Advice | Payer: Medicare Other | Attending: Emergency Medicine | Admitting: Emergency Medicine

## 2017-09-17 ENCOUNTER — Encounter (HOSPITAL_BASED_OUTPATIENT_CLINIC_OR_DEPARTMENT_OTHER): Payer: Self-pay | Admitting: Emergency Medicine

## 2017-09-17 ENCOUNTER — Other Ambulatory Visit: Payer: Self-pay | Admitting: Emergency Medicine

## 2017-09-17 DIAGNOSIS — Z5321 Procedure and treatment not carried out due to patient leaving prior to being seen by health care provider: Secondary | ICD-10-CM | POA: Diagnosis not present

## 2017-09-17 DIAGNOSIS — J029 Acute pharyngitis, unspecified: Secondary | ICD-10-CM | POA: Diagnosis present

## 2017-09-17 LAB — RAPID STREP SCREEN (MED CTR MEBANE ONLY): STREPTOCOCCUS, GROUP A SCREEN (DIRECT): NEGATIVE

## 2017-09-17 NOTE — ED Triage Notes (Signed)
PT presents with c/o sore throat left side of face  and left ear pain for a couple days.

## 2017-09-18 NOTE — ED Provider Notes (Signed)
Patient eloped after triage and prior to being evaluated by the provider. I had no contact with the patient.   Alveria Apley, PA-C 09/18/17 0103    Cathren Laine, MD 09/18/17 1257

## 2021-05-21 ENCOUNTER — Emergency Department (HOSPITAL_BASED_OUTPATIENT_CLINIC_OR_DEPARTMENT_OTHER)
Admission: EM | Admit: 2021-05-21 | Discharge: 2021-05-21 | Disposition: A | Discharge status: Home / Self Care | Payer: Medicare Other | Attending: Emergency Medicine | Admitting: Emergency Medicine

## 2021-05-21 ENCOUNTER — Other Ambulatory Visit: Payer: Self-pay

## 2021-05-21 ENCOUNTER — Encounter (HOSPITAL_BASED_OUTPATIENT_CLINIC_OR_DEPARTMENT_OTHER): Payer: Self-pay | Admitting: Emergency Medicine

## 2021-05-21 DIAGNOSIS — G43409 Hemiplegic migraine, not intractable, without status migrainosus: Secondary | ICD-10-CM | POA: Insufficient documentation

## 2021-05-21 DIAGNOSIS — Z79899 Other long term (current) drug therapy: Secondary | ICD-10-CM | POA: Insufficient documentation

## 2021-05-21 DIAGNOSIS — J45909 Unspecified asthma, uncomplicated: Secondary | ICD-10-CM | POA: Diagnosis not present

## 2021-05-21 DIAGNOSIS — I10 Essential (primary) hypertension: Secondary | ICD-10-CM | POA: Diagnosis not present

## 2021-05-21 DIAGNOSIS — R519 Headache, unspecified: Secondary | ICD-10-CM | POA: Diagnosis present

## 2021-05-21 DIAGNOSIS — F17210 Nicotine dependence, cigarettes, uncomplicated: Secondary | ICD-10-CM | POA: Insufficient documentation

## 2021-05-21 HISTORY — DX: Migraine, unspecified, not intractable, without status migrainosus: G43.909

## 2021-05-21 MED ORDER — SODIUM CHLORIDE 0.9 % IV BOLUS
1000.0000 mL | Freq: Once | INTRAVENOUS | Status: AC
  Administered 2021-05-21: 1000 mL via INTRAVENOUS

## 2021-05-21 MED ORDER — KETOROLAC TROMETHAMINE 15 MG/ML IJ SOLN
15.0000 mg | Freq: Once | INTRAMUSCULAR | Status: AC
  Administered 2021-05-21: 15 mg via INTRAVENOUS
  Filled 2021-05-21: qty 1

## 2021-05-21 MED ORDER — METOCLOPRAMIDE HCL 10 MG PO TABS: 10.0000 mg | ORAL_TABLET | Freq: Four times a day (QID) | ORAL | 0 refills | Status: AC | PRN

## 2021-05-21 MED ORDER — DIPHENHYDRAMINE HCL 50 MG/ML IJ SOLN
25.0000 mg | Freq: Once | INTRAMUSCULAR | Status: AC
  Administered 2021-05-21: 25 mg via INTRAVENOUS
  Filled 2021-05-21: qty 1

## 2021-05-21 MED ORDER — METOCLOPRAMIDE HCL 5 MG/ML IJ SOLN
10.0000 mg | Freq: Once | INTRAMUSCULAR | Status: AC
  Administered 2021-05-21: 10 mg via INTRAVENOUS
  Filled 2021-05-21: qty 2

## 2021-05-21 NOTE — ED Provider Notes (Signed)
[facility] Provider Note: Lowella Dell, MD, FACEP  CSN: [id number] MRN: [id number] ARRIVAL: 05/21/21 at 0252 ROOM: [facility]02/[facility]02   CHIEF COMPLAINT  Migraine   HISTORY OF PRESENT ILLNESS  05/21/21  Tracey Baker is a 30 y.o. female with a history of migraines.  She is here with a headache since yesterday.  The headache is located primarily on the top and left side of her head.  It is throbbing in nature.  It feels like previous migraines.  She rates it as a 10 out of 10.  She has had associated nausea, vomiting and photophobia.  She denies any focal numbness or weakness.   Past Medical History:  Diagnosis Date   Asthma    Depression    Hypertension    Migraine    Seasonal allergies     Past Surgical History:  Procedure Laterality Date   TONSILLECTOMY      No family history on file.  Social History   Tobacco Use   Smoking status: Every Day    Packs/day: 0.50    Pack years: 0.00    Types: Cigarettes   Smokeless tobacco: Never  Substance Use Topics   Alcohol use: No   Drug use: No    Prior to Admission medications   Medication Sig Start Date End Date Taking? Authorizing Provider  metoCLOPramide (REGLAN) 10 MG tablet Take 1 tablet (10 mg total) by mouth every 6 (six) hours as needed (For nausea/vomiting or migraine headache). 05/21/21  Yes Kenda Kloehn, MD  amLODipine (NORVASC) 10 MG tablet Take 10 mg by mouth daily.    [provider]  DULoxetine (CYMBALTA) 60 MG capsule Take 60 mg by mouth daily.    [provider]  FLUoxetine (PROZAC) 20 MG tablet Take 30 mg by mouth daily.     [provider]  hydrochlorothiazide (HYDRODIURIL) 25 MG tablet Take 25 mg by mouth daily.    [provider]  NIFEdipine (PROCARDIA XL/ADALAT-CC) 60 MG 24 hr tablet Take 60 mg by mouth daily.    [provider]  PRENATAL VITAMINS PO Take by mouth.    [provider]    Allergies Naproxen   REVIEW OF SYSTEMS  Negative  except as noted here or in the History of Present Illness.   PHYSICAL EXAMINATION  Initial Vital Signs Blood pressure (!) 162/111, pulse (!) 104, temperature 97.8 F (36.6 C), temperature source Oral, resp. rate 16, height 5' 8.5" (1.74 m), weight 135.2 kg, SpO2 100 %, unknown if currently breastfeeding.  Examination General: Well-developed, well-nourished female in no acute distress but vomiting; appearance consistent with age of record HENT: normocephalic; atraumatic Eyes: pupils equal, round and reactive to light; extraocular muscles intact; photophobia Neck: supple Heart: regular rate and rhythm Lungs: clear to auscultation bilaterally Abdomen: soft; nondistended; nontender; bowel sounds present Extremities: No deformity; full range of motion Neurologic: Awake, alert and oriented; motor function intact in all extremities and symmetric; no facial droop Skin: Warm and dry Psychiatric: Flat affect   RESULTS  Summary of this visit's results, reviewed and interpreted by myself:   EKG Interpretation  Date/Time:    Ventricular Rate:    PR Interval:    QRS Duration:   QT Interval:    QTC Calculation:   R Axis:     Text Interpretation:          Laboratory Studies: No results found for this or any previous visit (from the past 24 hour(s)). Imaging Studies: No results found.  ED COURSE and MDM  Nursing notes, initial and subsequent vitals signs, including pulse oximetry, reviewed and interpreted by myself.  Vitals:   05/21/21 0259 05/21/21 0300 05/21/21 0330  BP:  (!) 162/111 (!) 156/104  Pulse:  (!) 104 86  Resp:  16 18  Temp:  97.8 F (36.6 C)   TempSrc:  Oral   SpO2:  100% 98%  Weight: 135.2 kg    Height: 5' 8.5" (1.74 m)     Medications  sodium chloride 0.9 % bolus 1,000 mL (1,000 mLs Intravenous New Bag/Given 05/21/21 0320)  diphenhydrAMINE (BENADRYL) injection 25 mg (25 mg Intravenous Given 05/21/21 0324)  metoCLOPramide (REGLAN) injection 10 mg (10 mg  Intravenous Given 05/21/21 0329)  ketorolac (TORADOL) 15 MG/ML injection 15 mg (15 mg Intravenous Given 05/21/21 0321)    4:59 AM Patient feeling significantly better after IV medications.  She is drinking fluids without vomiting.  She would like to    PROCEDURES  Procedures   ED DIAGNOSES     ICD-10-CM   1. Sporadic migraine  G43.409          Hazelee Harbold, MD 05/21/21 3888

## 2021-05-21 NOTE — ED Triage Notes (Signed)
Pt report migraine headache x 2 days with vomiting.

## 2021-05-24 ENCOUNTER — Emergency Department (HOSPITAL_BASED_OUTPATIENT_CLINIC_OR_DEPARTMENT_OTHER)
Admission: EM | Admit: 2021-05-24 | Discharge: 2021-05-24 | Disposition: A | Discharge status: Home / Self Care | Payer: Medicare Other | Attending: Emergency Medicine | Admitting: Emergency Medicine

## 2021-05-24 ENCOUNTER — Emergency Department (HOSPITAL_BASED_OUTPATIENT_CLINIC_OR_DEPARTMENT_OTHER): Payer: Medicare Other

## 2021-05-24 ENCOUNTER — Encounter (HOSPITAL_BASED_OUTPATIENT_CLINIC_OR_DEPARTMENT_OTHER): Payer: Self-pay | Admitting: *Deleted

## 2021-05-24 ENCOUNTER — Other Ambulatory Visit: Payer: Self-pay

## 2021-05-24 DIAGNOSIS — R000 Tachycardia, unspecified: Secondary | ICD-10-CM | POA: Diagnosis not present

## 2021-05-24 DIAGNOSIS — J45909 Unspecified asthma, uncomplicated: Secondary | ICD-10-CM | POA: Diagnosis not present

## 2021-05-24 DIAGNOSIS — R509 Fever, unspecified: Secondary | ICD-10-CM | POA: Diagnosis present

## 2021-05-24 DIAGNOSIS — F17210 Nicotine dependence, cigarettes, uncomplicated: Secondary | ICD-10-CM | POA: Diagnosis not present

## 2021-05-24 DIAGNOSIS — Z79899 Other long term (current) drug therapy: Secondary | ICD-10-CM | POA: Insufficient documentation

## 2021-05-24 DIAGNOSIS — I10 Essential (primary) hypertension: Secondary | ICD-10-CM | POA: Diagnosis not present

## 2021-05-24 DIAGNOSIS — U071 COVID-19: Secondary | ICD-10-CM

## 2021-05-24 LAB — CBC WITH DIFFERENTIAL/PLATELET
Abs Immature Granulocytes: 0.04 10*3/uL (ref 0.00–0.07)
Basophils Absolute: 0.1 10*3/uL (ref 0.0–0.1)
Basophils Relative: 1 %
Eosinophils Absolute: 0 10*3/uL (ref 0.0–0.5)
Eosinophils Relative: 1 %
HCT: 37.1 % (ref 36.0–46.0)
Hemoglobin: 13.2 g/dL (ref 12.0–15.0)
Immature Granulocytes: 1 %
Lymphocytes Relative: 10 %
Lymphs Abs: 0.8 10*3/uL (ref 0.7–4.0)
MCH: 26.9 pg (ref 26.0–34.0)
MCHC: 35.6 g/dL (ref 30.0–36.0)
MCV: 75.7 fL — ABNORMAL LOW (ref 80.0–100.0)
Monocytes Absolute: 1.4 10*3/uL — ABNORMAL HIGH (ref 0.1–1.0)
Monocytes Relative: 19 %
Neutro Abs: 5.1 10*3/uL (ref 1.7–7.7)
Neutrophils Relative %: 68 %
Platelets: 534 10*3/uL — ABNORMAL HIGH (ref 150–400)
RBC: 4.9 MIL/uL (ref 3.87–5.11)
RDW: 14.6 % (ref 11.5–15.5)
WBC: 7.3 10*3/uL (ref 4.0–10.5)
nRBC: 0 % (ref 0.0–0.2)

## 2021-05-24 LAB — URINALYSIS, ROUTINE W REFLEX MICROSCOPIC
Bilirubin Urine: NEGATIVE
Glucose, UA: NEGATIVE mg/dL
Ketones, ur: NEGATIVE mg/dL
Leukocytes,Ua: NEGATIVE
Nitrite: NEGATIVE
Protein, ur: NEGATIVE mg/dL
Specific Gravity, Urine: 1.02 (ref 1.005–1.030)
pH: 5.5 (ref 5.0–8.0)

## 2021-05-24 LAB — COMPREHENSIVE METABOLIC PANEL
ALT: 12 U/L (ref 0–44)
AST: 14 U/L — ABNORMAL LOW (ref 15–41)
Albumin: 3.6 g/dL (ref 3.5–5.0)
Alkaline Phosphatase: 61 U/L (ref 38–126)
Anion gap: 9 (ref 5–15)
BUN: 9 mg/dL (ref 6–20)
CO2: 21 mmol/L — ABNORMAL LOW (ref 22–32)
Calcium: 8.8 mg/dL — ABNORMAL LOW (ref 8.9–10.3)
Chloride: 104 mmol/L (ref 98–111)
Creatinine, Ser: 0.96 mg/dL (ref 0.44–1.00)
GFR, Estimated: >60 mL/min (ref >60)
Glucose, Bld: 101 mg/dL — ABNORMAL HIGH (ref 70–99)
Potassium: 3.4 mmol/L — ABNORMAL LOW (ref 3.5–5.1)
Sodium: 134 mmol/L — ABNORMAL LOW (ref 135–145)
Total Bilirubin: 0.4 mg/dL (ref 0.3–1.2)
Total Protein: 8.5 g/dL — ABNORMAL HIGH (ref 6.5–8.1)

## 2021-05-24 LAB — URINALYSIS, MICROSCOPIC (REFLEX)

## 2021-05-24 LAB — RESP PANEL BY RT-PCR (FLU A&B, COVID) ARPGX2
Influenza A by PCR: NEGATIVE
Influenza B by PCR: NEGATIVE
SARS Coronavirus 2 by RT PCR: POSITIVE — AB

## 2021-05-24 LAB — PREGNANCY, URINE: Preg Test, Ur: NEGATIVE

## 2021-05-24 MED ORDER — LIDOCAINE VISCOUS HCL 2 % MT SOLN
15.0000 mL | Freq: Once | OROMUCOSAL | Status: AC
  Administered 2021-05-24: 15 mL via OROMUCOSAL
  Filled 2021-05-24: qty 15

## 2021-05-24 MED ORDER — ACETAMINOPHEN 325 MG PO TABS
650.0000 mg | ORAL_TABLET | Freq: Once | ORAL | Status: AC | PRN
  Administered 2021-05-24: 650 mg via ORAL
  Filled 2021-05-24: qty 2

## 2021-05-24 MED ORDER — SODIUM CHLORIDE 0.9 % IV BOLUS
1000.0000 mL | Freq: Once | INTRAVENOUS | Status: AC
  Administered 2021-05-24: 1000 mL via INTRAVENOUS

## 2021-05-24 MED ORDER — DEXAMETHASONE SODIUM PHOSPHATE 10 MG/ML IJ SOLN
10.0000 mg | Freq: Once | INTRAMUSCULAR | Status: AC
  Administered 2021-05-24: 10 mg via INTRAVENOUS
  Filled 2021-05-24: qty 1

## 2021-05-24 NOTE — Discharge Instructions (Addendum)
Please continue to take Tylenol as well as ibuprofen for management of your sore throat, fevers, and body aches.  Please make sure you are staying adequately hydrated.  Also, you need to make sure that you quarantine based on the CDC's current guidelines.  If you develop any new or worsening symptoms, please come back to the emergency department.  It was a pleasure to meet you.

## 2021-05-24 NOTE — ED Provider Notes (Signed)
[facility] EMERGENCY DEPARTMENT Provider Note   CSN: [id number] Arrival date & time: 05/24/21  1519     History No chief complaint on file.   Tracey Baker is a 30 y.o. female.  HPI Patient is a 30 year old female with a history of migraines, hypertension, depression, asthma, who presents to the emergency department due to fevers, diaphoresis, headache, sore throat.  Symptoms started yesterday.  She states she took a dose of Tylenol this morning with minimal relief.  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  Worsening pain when swallowing.  No chest pain, shortness of breath, abdominal pain, nausea, vomiting, diarrhea.  She has been vaccinated for COVID-19 x2.    Past Medical History:  Diagnosis Date   Asthma    Depression    Hypertension    Migraine    Seasonal allergies     There are no problems to display for this patient.   Past Surgical History:  Procedure Laterality Date   TONSILLECTOMY       OB History     Gravida  1   Para      Term      Preterm      AB      Living         SAB      IAB      Ectopic      Multiple      Live Births              No family history on file.  Social History   Tobacco Use   Smoking status: Every Day    Packs/day: 0.50    Pack years: 0.00    Types: Cigarettes   Smokeless tobacco: Never  Vaping Use   Vaping Use: Never used  Substance Use Topics   Alcohol use: No   Drug use: No    Home Medications Prior to Admission medications   Medication Sig Start Date End Date Taking? Authorizing Provider  amLODipine (NORVASC) 10 MG tablet Take 10 mg by mouth daily.   Yes [provider]  FLUoxetine (PROZAC) 20 MG tablet Take 30 mg by mouth daily.    Yes [provider]  NIFEdipine (PROCARDIA XL/ADALAT-CC) 60 MG 24 hr tablet Take 60 mg by mouth daily.   Yes [provider]  DULoxetine (CYMBALTA) 60 MG  capsule Take 60 mg by mouth daily.    [provider]  hydrochlorothiazide (HYDRODIURIL) 25 MG tablet Take 25 mg by mouth daily.    [provider]  metoCLOPramide (REGLAN) 10 MG tablet Take 1 tablet (10 mg total) by mouth every 6 (six) hours as needed (For nausea/vomiting or migraine headache). 05/21/21   Molpus, John, MD  PRENATAL VITAMINS PO Take by mouth.    [provider]    Allergies    Naproxen  Review of Systems   Review of Systems  All other systems reviewed and are negative. Ten systems reviewed and are negative for acute change, except as noted in the HPI.   Physical Exam Updated Vital Signs BP 120/77 (BP Location: Right Arm)   Pulse 94   Temp 99.9 F (37.7 C) (Oral)   Resp 20   Ht 5' 8.5" (1.74 m)   Wt 135.2 kg   SpO2 99%   BMI 44.66 kg/m   Physical Exam Vitals and nursing note reviewed.  Constitutional:  General: She is not in acute distress.    Appearance: Normal appearance. She is diaphoretic. She is not ill-appearing or toxic-appearing.  HENT:     Head: Normocephalic and atraumatic.     Right Ear: External ear normal.     Left Ear: External ear normal.     Nose: Nose normal.     Mouth/Throat:     Mouth: Mucous membranes are moist.     Pharynx: Oropharynx is clear. No oropharyngeal exudate or posterior oropharyngeal erythema.  Eyes:     General: No scleral icterus.       Right eye: No discharge.        Left eye: No discharge.     Extraocular Movements: Extraocular movements intact.     Conjunctiva/sclera: Conjunctivae normal.  Cardiovascular:     Rate and Rhythm: Regular rhythm. Tachycardia present.     Pulses: Normal pulses.     Heart sounds: Normal heart sounds. No murmur heard.   No friction rub. No gallop.  Pulmonary:     Effort: Pulmonary effort is normal. No respiratory distress.     Breath sounds: Normal breath sounds. No stridor. No wheezing, rhonchi or rales.     Comments: LCTAB without wheezing, rales or  rhonchi. Abdominal:     General: Abdomen is flat.     Palpations: Abdomen is soft.     Tenderness: There is no abdominal tenderness.  Musculoskeletal:        General: Normal range of motion.     Cervical back: Normal range of motion and neck supple. No tenderness.  Skin:    General: Skin is warm.  Neurological:     General: No focal deficit present.     Mental Status: She is alert and oriented to person, place, and time.  Psychiatric:        Mood and Affect: Mood normal.        Behavior: Behavior normal.    ED Results / Procedures / Treatments   Labs (all labs ordered are listed, but only abnormal results are displayed) Labs Reviewed  RESP PANEL BY RT-PCR (FLU A&B, COVID) ARPGX2 - Abnormal; Notable for the following components:      Result Value   SARS Coronavirus 2 by RT PCR POSITIVE (*)    All other components within normal limits  COMPREHENSIVE METABOLIC PANEL - Abnormal; Notable for the following components:   Sodium 134 (*)    Potassium 3.4 (*)    CO2 21 (*)    Glucose, Bld 101 (*)    Calcium 8.8 (*)    Total Protein 8.5 (*)    AST 14 (*)    All other components within normal limits  CBC WITH DIFFERENTIAL/PLATELET - Abnormal; Notable for the following components:   MCV 75.7 (*)    Platelets 534 (*)    Monocytes Absolute 1.4 (*)    All other components within normal limits  URINALYSIS, ROUTINE W REFLEX MICROSCOPIC - Abnormal; Notable for the following components:   APPearance HAZY (*)    Hgb urine dipstick LARGE (*)    All other components within normal limits  URINALYSIS, MICROSCOPIC (REFLEX) - Abnormal; Notable for the following components:   Bacteria, UA RARE (*)    All other components within normal limits  PREGNANCY, URINE   EKG None  Radiology DG Chest Portable 1 View  Result Date: 05/24/2021 CLINICAL DATA:  Fever, chills, fatigue, body aches, sore throat since this morning. EXAM: PORTABLE CHEST 1 VIEW COMPARISON:  04/02/2015 FINDINGS: Shallow  inspiration. The heart size and mediastinal contours are within normal limits. Both lungs are clear. The visualized skeletal structures are unremarkable. IMPRESSION: No active disease. Electronically Signed   By: Burman Nieves M.D.   On: 05/24/2021 18:16    Procedures Procedures   Medications Ordered in ED Medications  acetaminophen (TYLENOL) tablet 650 mg (650 mg Oral Given 05/24/21 1645)  sodium chloride 0.9 % bolus 1,000 mL (0 mLs Intravenous Stopped 05/24/21 1851)  dexamethasone (DECADRON) injection 10 mg (10 mg Intravenous Given 05/24/21 1851)  lidocaine (XYLOCAINE) 2 % viscous mouth solution 15 mL (15 mLs Mouth/Throat Given 05/24/21 1852)    ED Course  I have reviewed the triage vital signs and the nursing notes.  Pertinent labs & imaging results that were available during my care of the patient were reviewed by me and considered in my medical decision making (see chart for details).  Clinical Course as of 05/24/21 1857  Mon May 24, 2021  5631 Patient reassessed.  Stating she is feeling mild improvement in her symptoms.  Still has about 300 cc of IV fluids left.  Fevers improving.  Tachycardia improving.  BP within normal limits.  No hypoxia.  COVID-19 test is pending.  We will continue to monitor. [LJ]    Clinical Course User Index [LJ] Placido Sou, PA-C   MDM Rules/Calculators/A&P                          Pt is a 30 y.o. female who presents to the emergency department due to symptoms related to COVID-19.  Labs: CBC with an MCV of 75.7, platelets of 534, monocytes of 1.4. CMP with a sodium of 134, potassium of 3.4, CO2 of 21, glucose of 101, calcium of 8.8, total protein of 8.5, AST of 14. UA shows large hemoglobin, rare bacteria. COVID-19 test is positive.  Imaging: Chest x-ray is negative.  I, Placido Sou, PA-C, personally reviewed and evaluated these images and lab results as part of my medical decision-making.  Patient's COVID-19 test is positive today.   Day 2 of symptoms.  She has been vaccinated for COVID-19 x2.  Not boosted.  She works as a Engineer, site currently.  She was initially febrile and tachycardic.  She was given IV fluids as well as Tylenol and this has significantly improved.  She states that she is feeling much better.  Patient also given IV Decadron as well as viscous lidocaine for her sore throat.  Remaining work-up is reassuring.  Chest x-ray is negative.  No leukocytosis on CBC.  Mild hyponatremia.  Patient given 1 L normal saline.  Feel the patient is stable for discharge at this time and she is agreeable.  We discussed the need to quarantine in length.  OTC medications for management of her symptoms.  We discussed returning to the emergency department if her symptoms should worsen.  She verbalized understanding of the above plan.  Her questions were answered and she was amicable at the time of discharge.  Note: Portions of this report may have been transcribed using voice recognition software. Every effort was made to ensure accuracy; however, inadvertent computerized transcription errors may be present.   Final Clinical Impression(s) / ED Diagnoses Final diagnoses:  COVID-19   Rx / DC Orders ED Discharge Orders     None        Placido Sou, PA-C 05/24/21 1901    Tilden Fossa, MD 05/26/21 1931

## 2021-05-24 NOTE — ED Triage Notes (Signed)
Fever, chills, fatigue, body aches, sore throat and chills since this am. States she did an Covid test and strep test that were both negative this am.

## 2021-05-24 NOTE — ED Notes (Signed)
See EDP assessment 

## 2022-05-22 ENCOUNTER — Encounter (HOSPITAL_BASED_OUTPATIENT_CLINIC_OR_DEPARTMENT_OTHER): Payer: Self-pay | Admitting: Emergency Medicine

## 2022-05-22 ENCOUNTER — Other Ambulatory Visit: Payer: Self-pay

## 2022-05-22 ENCOUNTER — Emergency Department (HOSPITAL_BASED_OUTPATIENT_CLINIC_OR_DEPARTMENT_OTHER)
Admission: EM | Admit: 2022-05-22 | Discharge: 2022-05-22 | Disposition: A | Discharge status: Home / Self Care | Payer: PRIVATE HEALTH INSURANCE | Attending: Emergency Medicine | Admitting: Emergency Medicine

## 2022-05-22 DIAGNOSIS — Z79899 Other long term (current) drug therapy: Secondary | ICD-10-CM | POA: Insufficient documentation

## 2022-05-22 DIAGNOSIS — I10 Essential (primary) hypertension: Secondary | ICD-10-CM | POA: Insufficient documentation

## 2022-05-22 DIAGNOSIS — U071 COVID-19: Secondary | ICD-10-CM | POA: Diagnosis not present

## 2022-05-22 DIAGNOSIS — J45909 Unspecified asthma, uncomplicated: Secondary | ICD-10-CM | POA: Insufficient documentation

## 2022-05-22 DIAGNOSIS — R059 Cough, unspecified: Secondary | ICD-10-CM | POA: Diagnosis present

## 2022-05-22 MED ORDER — NIRMATRELVIR/RITONAVIR (PAXLOVID)TABLET: 3.0000 | ORAL_TABLET | Freq: Two times a day (BID) | ORAL | 0 refills | Status: AC

## 2022-05-22 MED ORDER — ONDANSETRON HCL 4 MG PO TABS: 4.0000 mg | ORAL_TABLET | Freq: Three times a day (TID) | ORAL | 0 refills | Status: AC | PRN

## 2022-05-22 NOTE — ED Triage Notes (Signed)
Pt reports generalized body aches, cough, and nasal congestion for the past 3 days. Home test for Covid was positive.

## 2022-05-22 NOTE — Discharge Instructions (Signed)
Based on your report that you had a positive COVID test, I suspect your symptoms are due to COVID-19 infection.  Your exam was reassuring with normal vital signs, no hypoxia, and lungs were clear.  No rhonchi or wheezing were appreciated.  We had a shared decision-making conversation and given your reassuring exam agree to hold on labs and x-ray at this time.  We do feel that given your comorbidities of hypertension and asthma you could benefit from the COVID medication Paxlovid.  To make sure you stay hydrated if you have nausea, we will also print prescription for Zofran.  Please rest and stay hydrated and stay isolated.  If any symptoms change or worsen acutely, please return to the nearest emergency department.

## 2022-05-22 NOTE — ED Provider Notes (Signed)
[facility] EMERGENCY DEPARTMENT Provider Note   CSN: [id number] Arrival date & time: 05/22/22  0941     History  Chief Complaint  Patient presents with   Generalized Body Aches    Tracey Baker is a 31 y.o. female.  The history is provided by the patient and medical records. No language interpreter was used.  URI Presenting symptoms: congestion, cough, fatigue and rhinorrhea   Presenting symptoms: no fever   Severity:  Moderate Onset quality:  Gradual Duration:  3 days Timing:  Constant Progression:  Waxing and waning Chronicity:  New Relieved by:  Nothing Worsened by:  Nothing Ineffective treatments:  None tried Associated symptoms: sneezing   Associated symptoms: no headaches, no neck pain and no wheezing        Home Medications Prior to Admission medications   Medication Sig Start Date End Date Taking? Authorizing Provider  amLODipine (NORVASC) 10 MG tablet Take 10 mg by mouth daily.    [provider]  DULoxetine (CYMBALTA) 60 MG capsule Take 60 mg by mouth daily.    [provider]  FLUoxetine (PROZAC) 20 MG tablet Take 30 mg by mouth daily.     [provider]  hydrochlorothiazide (HYDRODIURIL) 25 MG tablet Take 25 mg by mouth daily.    [provider]  metoCLOPramide (REGLAN) 10 MG tablet Take 1 tablet (10 mg total) by mouth every 6 (six) hours as needed (For nausea/vomiting or migraine headache). 05/21/21   Molpus, John, MD  NIFEdipine (PROCARDIA XL/ADALAT-CC) 60 MG 24 hr tablet Take 60 mg by mouth daily.    [provider]  PRENATAL VITAMINS PO Take by mouth.    [provider]      Allergies    Naproxen    Review of Systems   Review of Systems  Constitutional:  Positive for chills and fatigue. Negative for diaphoresis and fever.  HENT:  Positive for congestion, rhinorrhea and sneezing.   Eyes:  Negative for visual disturbance.  Respiratory:  Positive for cough. Negative for chest  tightness, shortness of breath and wheezing.   Cardiovascular:  Negative for chest pain.  Gastrointestinal:  Negative for abdominal pain, constipation, diarrhea, nausea and vomiting.  Genitourinary:  Negative for dysuria and flank pain.  Musculoskeletal:  Negative for back pain, neck pain and neck stiffness.  Skin:  Negative for rash and wound.  Neurological:  Negative for light-headedness and headaches.  Psychiatric/Behavioral:  Negative for agitation and confusion.   All other systems reviewed and are negative.   Physical Exam Updated Vital Signs BP 139/88 (BP Location: Right Arm)   Temp 97.7 F (36.5 C) (Oral)   Resp 18   Ht 5\' 8"  (1.727 m)   Wt 131.5 kg   SpO2 98%   BMI 44.09 kg/m  Physical Exam Vitals and nursing note reviewed.  Constitutional:      General: She is not in acute distress.    Appearance: She is well-developed. She is not ill-appearing, toxic-appearing or diaphoretic.  HENT:     Head: Normocephalic and atraumatic.     Nose: Rhinorrhea present.     Mouth/Throat:     Mouth: Mucous membranes are moist.     Pharynx: No oropharyngeal exudate or posterior oropharyngeal erythema.  Eyes:     Extraocular Movements: Extraocular movements intact.     Conjunctiva/sclera: Conjunctivae normal.     Pupils: Pupils are equal, round, and reactive to light.  Cardiovascular:     Rate and Rhythm: Normal rate and regular  rhythm.     Heart sounds: No murmur heard. Pulmonary:     Effort: Pulmonary effort is normal. No respiratory distress.     Breath sounds: Normal breath sounds. No wheezing, rhonchi or rales.  Chest:     Chest wall: No tenderness.  Abdominal:     General: Abdomen is flat.     Palpations: Abdomen is soft.     Tenderness: There is no abdominal tenderness. There is no right CVA tenderness, left CVA tenderness, guarding or rebound.  Musculoskeletal:        General: No swelling or tenderness.     Cervical back: Neck supple. No tenderness.     Right lower  leg: No edema.     Left lower leg: No edema.  Skin:    General: Skin is warm and dry.     Capillary Refill: Capillary refill takes less than 2 seconds.     Findings: No erythema or rash.  Neurological:     General: No focal deficit present.     Mental Status: She is alert.  Psychiatric:        Mood and Affect: Mood normal.     ED Results / Procedures / Treatments   Labs (all labs ordered are listed, but only abnormal results are displayed) Labs Reviewed - No data to display  EKG None  Radiology No results found.  Procedures Procedures    Medications Ordered in ED Medications - No data to display  ED Course/ Medical Decision Making/ A&P                           Medical Decision Making   Tracey Baker is a [age] y.o. female with a past medical history significant for migraines, depression, hypertension, allergies, and asthma who presents with URI symptoms and a positive COVID test at home.  According to patient, for the last 3 days she has been having symptoms with rhinorrhea, congestion, cough, fatigue, and malaise.  She reports he took a home COVID test and was positive and wanted to come to make sure she did not need other medications.  She reports that she has been feeling slightly dehydrated but has been trying to drink water.  She denies nausea or vomiting.  She denies any chest pain or shortness of breath.  No reported wheezing.  No abdominal pain, back pain, or flank pain.  No leg pain or leg swelling.  She denies other complaints.  She just has congestion and rhinorrhea.  On exam, lungs were clear with no wheezing or rhonchi.  Chest was nontender.  Abdomen nontender.  Vital signs reassuring.  Legs nontender.  Patient otherwise well-appearing.  No back tenderness or neck tenderness.        Given patient's reassuring exam I do suspect that her symptoms are due to the COVID-19 infection.  With her clear breath sounds and normal vitals we had a shared decision-making  conversation that we did not feel that she would necessarily need x-ray or labs today.  If we did a COVID test and it was negative I would not trust it and we will still treat her as if she has COVID.  Patient agrees.  She does want the Paxlovid given her history of asthma and hypertension to help prevent her worsening and needing admission.  We will give her prescription her Paxlovid as well as Zofran to make sure she is staying hydrated if she develops nausea which she has had  in the past.  Patient agrees with plan of care and was reassuring.  Patient did not have any hypoxia and had reassuring vitals.  Patient discharged in good condition for outpatient isolation and management of COVID-19.        Final Clinical Impression(s) / ED Diagnoses Final diagnoses:  COVID-19    Rx / DC Orders ED Discharge Orders          Ordered    nirmatrelvir/ritonavir EUA (PAXLOVID) 20 x 150 MG & 10 x 100MG  TABS  2 times daily        05/22/22 1043    ondansetron (ZOFRAN) 4 MG tablet  Every 8 hours PRN        05/22/22 1043            Clinical Impression: 1. COVID-19     Disposition: Discharge  Condition: Good  I have discussed the results, Dx and Tx plan with the pt(& family if present). He/she/they expressed understanding and agree(s) with the plan. Discharge instructions discussed at great length. Strict return precautions discussed and pt &/or family have verbalized understanding of the instructions. No further questions at time of discharge.    New Prescriptions   NIRMATRELVIR/RITONAVIR EUA (PAXLOVID) 20 X 150 MG & 10 X 100MG  TABS    Take 3 tablets by mouth 2 (two) times daily for 5 days. Patient GFR is >90. Take nirmatrelvir (150 mg) two tablets twice daily for 5 days and ritonavir (100 mg) one tablet twice daily for 5 days.   ONDANSETRON (ZOFRAN) 4 MG TABLET    Take 1 tablet (4 mg total) by mouth every 8 (eight) hours as needed for nausea or vomiting.    Follow Up: 07/22/22,  MD [address] [place] [address] [place] [phone]     [facility] [address] [facility] [place] [id number] [facility] [place] [phone]       Uriah Trueba, [id number], MD 05/22/22 1056

## 2022-05-22 NOTE — ED Notes (Signed)
RT walked to room and obtained vitals. No distress noted. Stated she had a positive covid home test last night.

## 2023-10-05 ENCOUNTER — Emergency Department (HOSPITAL_BASED_OUTPATIENT_CLINIC_OR_DEPARTMENT_OTHER): Admission: EM | Admit: 2023-10-05 | Discharge: 2023-10-05 | Discharge status: Against Medical Advice | Payer: 59

## 2023-10-05 ENCOUNTER — Other Ambulatory Visit: Payer: Self-pay

## 2023-10-05 NOTE — ED Notes (Addendum)
Pt called to triage room , while assessing , pt sts that she already have pain meds and orthopedic doctor , and that she will follow up with them .  Pt refuses to continue the triage process.  After explaining the process of the visit , she replied that " this is a waste of her time and insurance".  Pt left prior to triage completion .
